# Patient Record
Sex: Female | Born: 1995 | Race: Asian | Hispanic: No | Marital: Single | Smoking: Never smoker
Health system: Southern US, Community
[De-identification: ages and names within clinical notes are randomized; demographics above are authoritative.]

## PROBLEM LIST (undated history)

## (undated) HISTORY — PX: APPENDECTOMY: SHX54

---

## 2019-09-13 ENCOUNTER — Encounter: Payer: Self-pay | Admitting: Emergency Medicine

## 2019-09-13 ENCOUNTER — Emergency Department: Payer: Managed Care, Other (non HMO)

## 2019-09-13 ENCOUNTER — Other Ambulatory Visit: Payer: Self-pay

## 2019-09-13 ENCOUNTER — Emergency Department
Admission: EM | Admit: 2019-09-13 | Discharge: 2019-09-13 | Disposition: A | Discharge status: Home / Self Care | Payer: Managed Care, Other (non HMO) | Attending: Emergency Medicine | Admitting: Emergency Medicine

## 2019-09-13 DIAGNOSIS — Y999 Unspecified external cause status: Secondary | ICD-10-CM | POA: Diagnosis not present

## 2019-09-13 DIAGNOSIS — S6992XA Unspecified injury of left wrist, hand and finger(s), initial encounter: Secondary | ICD-10-CM | POA: Diagnosis present

## 2019-09-13 DIAGNOSIS — Y929 Unspecified place or not applicable: Secondary | ICD-10-CM | POA: Insufficient documentation

## 2019-09-13 DIAGNOSIS — Y9389 Activity, other specified: Secondary | ICD-10-CM | POA: Insufficient documentation

## 2019-09-13 DIAGNOSIS — S52501A Unspecified fracture of the lower end of right radius, initial encounter for closed fracture: Secondary | ICD-10-CM

## 2019-09-13 DIAGNOSIS — W010XXA Fall on same level from slipping, tripping and stumbling without subsequent striking against object, initial encounter: Secondary | ICD-10-CM | POA: Insufficient documentation

## 2019-09-13 DIAGNOSIS — S52502A Unspecified fracture of the lower end of left radius, initial encounter for closed fracture: Secondary | ICD-10-CM | POA: Diagnosis not present

## 2019-09-13 MED ORDER — IBUPROFEN 600 MG PO TABS: 600.0000 mg | ORAL_TABLET | Freq: Three times a day (TID) | ORAL | 0 refills | Status: DC | PRN

## 2019-09-13 MED ORDER — HYDROMORPHONE HCL 1 MG/ML IJ SOLN
1.0000 mg | Freq: Once | INTRAMUSCULAR | Status: AC
  Administered 2019-09-13: 1 mg via INTRAMUSCULAR
  Filled 2019-09-13: qty 1

## 2019-09-13 MED ORDER — KETOROLAC TROMETHAMINE 60 MG/2ML IM SOLN
60.0000 mg | Freq: Once | INTRAMUSCULAR | Status: DC
  Filled 2019-09-13: qty 2

## 2019-09-13 MED ORDER — KETOROLAC TROMETHAMINE 30 MG/ML IJ SOLN
30.0000 mg | Freq: Once | INTRAMUSCULAR | Status: AC
  Administered 2019-09-13: 30 mg via INTRAVENOUS

## 2019-09-13 MED ORDER — OXYCODONE-ACETAMINOPHEN 7.5-325 MG PO TABS: 1.0000 | ORAL_TABLET | Freq: Four times a day (QID) | ORAL | 0 refills | Status: DC | PRN

## 2019-09-13 NOTE — ED Notes (Signed)
See triage note  States she fell while trying to help b/f  Positive deformity   Good pulses

## 2019-09-13 NOTE — ED Provider Notes (Signed)
.  Ortho Injury Treatment  Date/Time: 09/13/2019 3:04 PM Performed by: Arta Silence, MD Authorized by: Arta Silence, MD   Consent:    Consent obtained:  Verbal   Consent given by:  Patient   Risks discussed:  Fracture, nerve damage, restricted joint movement and stiffness   Alternatives discussed:  ImmobilizationInjury location: wrist Location details: left wrist Injury type: fracture Fracture type: distal radius Pre-procedure neurovascular assessment: neurovascularly intact Pre-procedure distal perfusion: normal Pre-procedure neurological function: normal Pre-procedure range of motion: normal Anesthesia: hematoma block  Anesthesia: Local anesthesia used: yes Local Anesthetic: lidocaine 1% without epinephrine Anesthetic total: 5 mL  Patient sedated: NoManipulation performed: yes Skin traction used: yes Reduction successful: yes X-ray confirmed reduction: yes Immobilization: splint Splint type: sugar tong Supplies used: Ortho-Glass Post-procedure neurovascular assessment: post-procedure neurovascularly intact Post-procedure distal perfusion: normal Post-procedure neurological function: normal Post-procedure range of motion: normal Patient tolerance: patient tolerated the procedure well with no immediate complications       Arta Silence, MD 09/13/19 1505
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
## 2019-09-13 NOTE — Discharge Instructions (Signed)
Follow discharge care instruction and wear splint until evaluation by orthopedics.  Be advised pain medication may cause drowsiness.

## 2019-09-13 NOTE — ED Notes (Signed)
E-signature not working at this time. Pt verbalized understanding of D/C instructions, prescriptions and follow up care with no further questions at this time. Pt in NAD and ambulatory at time of D/C.  

## 2019-09-16 ENCOUNTER — Other Ambulatory Visit: Payer: Self-pay

## 2019-09-16 ENCOUNTER — Other Ambulatory Visit: Payer: Self-pay | Admitting: Orthopedic Surgery

## 2019-09-16 ENCOUNTER — Other Ambulatory Visit
Admission: RE | Admit: 2019-09-16 | Discharge: 2019-09-16 | Disposition: A | Discharge status: Home / Self Care | Payer: Managed Care, Other (non HMO) | Source: Ambulatory Visit | Attending: Orthopedic Surgery | Admitting: Orthopedic Surgery

## 2019-09-16 DIAGNOSIS — Z20822 Contact with and (suspected) exposure to covid-19: Secondary | ICD-10-CM | POA: Insufficient documentation

## 2019-09-16 DIAGNOSIS — Z01812 Encounter for preprocedural laboratory examination: Secondary | ICD-10-CM | POA: Insufficient documentation

## 2019-09-16 MED ORDER — CEFAZOLIN SODIUM-DEXTROSE 2-4 GM/100ML-% IV SOLN
2.0000 g | INTRAVENOUS | Status: AC
  Administered 2019-09-17: 16:00:00 2 g via INTRAVENOUS

## 2019-09-17 ENCOUNTER — Ambulatory Visit: Payer: Managed Care, Other (non HMO) | Admitting: Anesthesiology

## 2019-09-17 ENCOUNTER — Other Ambulatory Visit: Payer: Self-pay

## 2019-09-17 ENCOUNTER — Ambulatory Visit: Payer: Managed Care, Other (non HMO)

## 2019-09-17 ENCOUNTER — Encounter: Payer: Self-pay | Admitting: Orthopedic Surgery

## 2019-09-17 ENCOUNTER — Ambulatory Visit
Admission: RE | Admit: 2019-09-17 | Discharge: 2019-09-17 | Disposition: A | Discharge status: Home / Self Care | Payer: Managed Care, Other (non HMO) | Attending: Orthopedic Surgery | Admitting: Orthopedic Surgery

## 2019-09-17 ENCOUNTER — Encounter
Admission: RE | Disposition: A | Discharge status: Home / Self Care | Payer: Self-pay | Source: Home / Self Care | Attending: Orthopedic Surgery

## 2019-09-17 DIAGNOSIS — S52502A Unspecified fracture of the lower end of left radius, initial encounter for closed fracture: Secondary | ICD-10-CM | POA: Diagnosis present

## 2019-09-17 DIAGNOSIS — W010XXA Fall on same level from slipping, tripping and stumbling without subsequent striking against object, initial encounter: Secondary | ICD-10-CM | POA: Diagnosis not present

## 2019-09-17 DIAGNOSIS — S52612A Displaced fracture of left ulna styloid process, initial encounter for closed fracture: Secondary | ICD-10-CM | POA: Insufficient documentation

## 2019-09-17 DIAGNOSIS — Z8781 Personal history of (healed) traumatic fracture: Secondary | ICD-10-CM

## 2019-09-17 DIAGNOSIS — G5602 Carpal tunnel syndrome, left upper limb: Secondary | ICD-10-CM | POA: Insufficient documentation

## 2019-09-17 HISTORY — PX: OPEN REDUCTION INTERNAL FIXATION (ORIF) DISTAL RADIAL FRACTURE: SHX5989

## 2019-09-17 HISTORY — PX: CARPAL TUNNEL RELEASE: SHX101

## 2019-09-17 LAB — SARS CORONAVIRUS 2 (TAT 6-24 HRS): SARS Coronavirus 2: NEGATIVE

## 2019-09-17 LAB — POCT PREGNANCY, URINE
Preg Test, Ur: NEGATIVE
Preg Test, Ur: NEGATIVE

## 2019-09-17 SURGERY — OPEN REDUCTION INTERNAL FIXATION (ORIF) DISTAL RADIUS FRACTURE
Anesthesia: General | Laterality: Left

## 2019-09-17 MED ORDER — DEXMEDETOMIDINE HCL 200 MCG/2ML IV SOLN
INTRAVENOUS | Status: DC | PRN
  Administered 2019-09-17: 8 ug via INTRAVENOUS

## 2019-09-17 MED ORDER — FENTANYL CITRATE (PF) 100 MCG/2ML IJ SOLN
INTRAMUSCULAR | Status: AC
  Filled 2019-09-17: qty 2

## 2019-09-17 MED ORDER — BUPIVACAINE HCL (PF) 0.5 % IJ SOLN
INTRAMUSCULAR | Status: AC
  Filled 2019-09-17: qty 30

## 2019-09-17 MED ORDER — NEOMYCIN-POLYMYXIN B GU 40-200000 IR SOLN
Status: DC | PRN
  Administered 2019-09-17: 4 mL

## 2019-09-17 MED ORDER — BUPIVACAINE HCL 0.5 % IJ SOLN
INTRAMUSCULAR | Status: DC | PRN
  Administered 2019-09-17: 10 mL

## 2019-09-17 MED ORDER — CEFAZOLIN SODIUM-DEXTROSE 2-4 GM/100ML-% IV SOLN
INTRAVENOUS | Status: AC
  Filled 2019-09-17: qty 100

## 2019-09-17 MED ORDER — NEOMYCIN-POLYMYXIN B GU 40-200000 IR SOLN
Status: AC
  Filled 2019-09-17: qty 20

## 2019-09-17 MED ORDER — ONDANSETRON HCL 4 MG/2ML IJ SOLN: 4.0000 mg | Freq: Once | INTRAMUSCULAR | Status: DC | PRN

## 2019-09-17 MED ORDER — FENTANYL CITRATE (PF) 100 MCG/2ML IJ SOLN
25.0000 ug | INTRAMUSCULAR | Status: DC | PRN
  Administered 2019-09-17 (×3): 25 ug via INTRAVENOUS

## 2019-09-17 MED ORDER — PHENYLEPHRINE HCL (PRESSORS) 10 MG/ML IV SOLN
INTRAVENOUS | Status: DC | PRN
  Administered 2019-09-17: 50 ug via INTRAVENOUS

## 2019-09-17 MED ORDER — FENTANYL CITRATE (PF) 100 MCG/2ML IJ SOLN
INTRAMUSCULAR | Status: DC | PRN
  Administered 2019-09-17: 50 ug via INTRAVENOUS
  Administered 2019-09-17 (×2): 25 ug via INTRAVENOUS

## 2019-09-17 MED ORDER — OXYCODONE-ACETAMINOPHEN 7.5-325 MG PO TABS
ORAL_TABLET | ORAL | Status: AC
  Filled 2019-09-17: qty 1

## 2019-09-17 MED ORDER — CHLORHEXIDINE GLUCONATE 4 % EX LIQD
60.0000 mL | Freq: Once | CUTANEOUS | Status: AC
  Administered 2019-09-17: 4 via TOPICAL

## 2019-09-17 MED ORDER — LACTATED RINGERS IV SOLN: INTRAVENOUS | Status: DC

## 2019-09-17 MED ORDER — LIDOCAINE HCL (CARDIAC) PF 100 MG/5ML IV SOSY
PREFILLED_SYRINGE | INTRAVENOUS | Status: DC | PRN
  Administered 2019-09-17: 60 mg via INTRAVENOUS

## 2019-09-17 MED ORDER — GLYCOPYRROLATE 0.2 MG/ML IJ SOLN
INTRAMUSCULAR | Status: DC | PRN
  Administered 2019-09-17: .2 mg via INTRAVENOUS

## 2019-09-17 MED ORDER — MIDAZOLAM HCL 2 MG/2ML IJ SOLN
INTRAMUSCULAR | Status: AC
  Filled 2019-09-17: qty 2

## 2019-09-17 MED ORDER — ONDANSETRON HCL 4 MG/2ML IJ SOLN
INTRAMUSCULAR | Status: DC | PRN
  Administered 2019-09-17: 4 mg via INTRAVENOUS

## 2019-09-17 MED ORDER — PROPOFOL 10 MG/ML IV BOLUS
INTRAVENOUS | Status: DC | PRN
  Administered 2019-09-17: 200 mg via INTRAVENOUS

## 2019-09-17 MED ORDER — PROPOFOL 10 MG/ML IV BOLUS
INTRAVENOUS | Status: AC
  Filled 2019-09-17: qty 20

## 2019-09-17 MED ORDER — MIDAZOLAM HCL 2 MG/2ML IJ SOLN
INTRAMUSCULAR | Status: DC | PRN
  Administered 2019-09-17: 2 mg via INTRAVENOUS

## 2019-09-17 MED ORDER — OXYCODONE-ACETAMINOPHEN 7.5-325 MG PO TABS
1.0000 | ORAL_TABLET | Freq: Four times a day (QID) | ORAL | Status: DC | PRN
  Administered 2019-09-17: 1 via ORAL

## 2019-09-17 MED ORDER — DEXAMETHASONE SODIUM PHOSPHATE 10 MG/ML IJ SOLN
INTRAMUSCULAR | Status: DC | PRN
  Administered 2019-09-17 (×2): 8 mg via INTRAVENOUS

## 2019-09-17 SURGICAL SUPPLY — 38 items
BNDG ELASTIC 3X5.8 VLCR STR LF (GAUZE/BANDAGES/DRESSINGS) ×2 IMPLANT
BNDG ELASTIC 4X5.8 VLCR STR LF (GAUZE/BANDAGES/DRESSINGS) ×2 IMPLANT
CANISTER SUCT 1200ML W/VALVE (MISCELLANEOUS) ×2 IMPLANT
CHLORAPREP W/TINT 26 (MISCELLANEOUS) ×2 IMPLANT
COVER WAND RF STERILE (DRAPES) ×2 IMPLANT
CUFF TOURN SGL QUICK 18X4 (TOURNIQUET CUFF) ×1 IMPLANT
DRAPE FLUOR MINI C-ARM 54X84 (DRAPES) ×2 IMPLANT
ELECT CAUTERY NDL 2.0 MIC (NEEDLE) IMPLANT
ELECT CAUTERY NEEDLE 2.0 MIC (NEEDLE) IMPLANT
ELECT REM PT RETURN 9FT ADLT (ELECTROSURGICAL) ×2
ELECTRODE REM PT RTRN 9FT ADLT (ELECTROSURGICAL) ×1 IMPLANT
GAUZE SPONGE 4X4 12PLY STRL (GAUZE/BANDAGES/DRESSINGS) ×2 IMPLANT
GAUZE XEROFORM 1X8 LF (GAUZE/BANDAGES/DRESSINGS) ×4 IMPLANT
GLOVE SURG SYN 9.0  PF PI (GLOVE) ×1
GLOVE SURG SYN 9.0 PF PI (GLOVE) ×1 IMPLANT
GOWN SRG 2XL LVL 4 RGLN SLV (GOWNS) ×1 IMPLANT
GOWN STRL NON-REIN 2XL LVL4 (GOWNS) ×1
GOWN STRL REUS W/ TWL LRG LVL3 (GOWN DISPOSABLE) ×1 IMPLANT
GOWN STRL REUS W/TWL LRG LVL3 (GOWN DISPOSABLE) ×1
KIT TURNOVER KIT A (KITS) ×2 IMPLANT
NDL FILTER BLUNT 18X1 1/2 (NEEDLE) ×1 IMPLANT
NEEDLE FILTER BLUNT 18X 1/2SAF (NEEDLE) ×1
NEEDLE FILTER BLUNT 18X1 1/2 (NEEDLE) ×1 IMPLANT
NS IRRIG 500ML POUR BTL (IV SOLUTION) ×2 IMPLANT
PACK EXTREMITY ARMC (MISCELLANEOUS) ×2 IMPLANT
PAD CAST CTTN 4X4 STRL (SOFTGOODS) ×2 IMPLANT
PADDING CAST COTTON 4X4 STRL (SOFTGOODS) ×2
PEG LOCKING SMOOTH 2.2X14 (Peg) ×1 IMPLANT
PEG LOCKING SMOOTH 2.2X16 (Screw) ×2 IMPLANT
PEG LOCKING SMOOTH 2.2X18 (Peg) ×1 IMPLANT
SCALPEL PROTECTED #15 DISP (BLADE) ×4 IMPLANT
SCREW CORT 3.5X10 LNG (Screw) ×3 IMPLANT
SPLINT CAST 1 STEP 3X12 (MISCELLANEOUS) ×2 IMPLANT
SUT ETHILON 4-0 (SUTURE) ×1
SUT ETHILON 4-0 FS2 18XMFL BLK (SUTURE) ×1
SUT VICRYL 3-0 27IN (SUTURE) ×2 IMPLANT
SUTURE ETHLN 4-0 FS2 18XMF BLK (SUTURE) ×1 IMPLANT
SYR 3ML LL SCALE MARK (SYRINGE) ×2 IMPLANT

## 2019-09-17 NOTE — Anesthesia Preprocedure Evaluation (Signed)
Anesthesia Evaluation  Patient identified by MRN, date of birth, ID band Patient awake    Reviewed: Allergy & Precautions, H&P , NPO status , Patient's Chart, lab work & pertinent test results, reviewed documented beta blocker date and time   Airway Mallampati: II  TM Distance: >3 FB Neck ROM: full    Dental  (+) Teeth Intact   Pulmonary neg pulmonary ROS,    Pulmonary exam normal        Cardiovascular Exercise Tolerance: Good negative cardio ROS Normal cardiovascular exam Rate:Normal     Neuro/Psych negative neurological ROS  negative psych ROS   GI/Hepatic negative GI ROS, Neg liver ROS,   Endo/Other  negative endocrine ROS  Renal/GU negative Renal ROS  negative genitourinary   Musculoskeletal   Abdominal   Peds  Hematology negative hematology ROS (+)   Anesthesia Other Findings   Reproductive/Obstetrics negative OB ROS                             Anesthesia Physical Anesthesia Plan  ASA: I and emergent  Anesthesia Plan: General LMA   Post-op Pain Management:    Induction:   PONV Risk Score and Plan:   Airway Management Planned:   Additional Equipment:   Intra-op Plan:   Post-operative Plan:   Informed Consent: I have reviewed the patients History and Physical, chart, labs and discussed the procedure including the risks, benefits and alternatives for the proposed anesthesia with the patient or authorized representative who has indicated his/her understanding and acceptance.       Plan Discussed with: CRNA  Anesthesia Plan Comments:         Anesthesia Quick Evaluation

## 2019-09-17 NOTE — Anesthesia Procedure Notes (Signed)
Procedure Name: LMA Insertion Date/Time: 09/17/2019 4:11 PM Performed by: Hedda Slade, CRNA Pre-anesthesia Checklist: Patient identified, Patient being monitored, Timeout performed, Emergency Drugs available and Suction available Patient Re-evaluated:Patient Re-evaluated prior to induction Oxygen Delivery Method: Circle system utilized Preoxygenation: Pre-oxygenation with 100% oxygen Induction Type: IV induction Ventilation: Mask ventilation without difficulty LMA: LMA inserted LMA Size: 3.5 Tube type: Oral Number of attempts: 1 Placement Confirmation: positive ETCO2 and breath sounds checked- equal and bilateral Tube secured with: Tape Dental Injury: Teeth and Oropharynx as per pre-operative assessment

## 2019-09-17 NOTE — Op Note (Signed)
09/17/2019  5:23 PM  PATIENT:  Emma Murray  24 y.o. female  PRE-OPERATIVE DIAGNOSIS:  DISPLACED FRACTURE OF DISTAL END OF LEFT RADIUS, acute carpal tunnel syndrome  POST-OPERATIVE DIAGNOSIS:  DISPLACED FRACTURE OF DISTAL END OF LEFT RADIUS, acute carpal tunnel syndrome  PROCEDURE:  Procedure(s): OPEN REDUCTION INTERNAL FIXATION (ORIF) DISTAL RADIAL FRACTURE (Left) CARPAL TUNNEL RELEASE (Left)  SURGEON: Laurene Footman, MD  ASSISTANTS: None  ANESTHESIA:   general  EBL:  Total I/O In: 900 [I.V.:800; IV Piggyback:100] Out: 10 [Blood:10]  BLOOD ADMINISTERED:none  DRAINS: none   LOCAL MEDICATIONS USED:  MARCAINE     SPECIMEN:  No Specimen  DISPOSITION OF SPECIMEN:  N/A  COUNTS:  YES  TOURNIQUET:   Total Tourniquet Time Documented: Upper Arm (Left) - 29 minutes Total: Upper Arm (Left) - 29 minutes   IMPLANTS: Hand innovations short narrow DVR plate with multiple smooth pegs and 3 cortical screws  DICTATION: .Dragon Dictation patient was brought to the operating room and after adequate general anesthesia was obtained the left arm was prepped and draped in the usual sterile fashion.  After patient identification and timeout procedures were completed, tourniquet was raised.  A volar approach was made centered over the FCR tendon.  Tendon sheath incised and the tendon retracted radially with radial artery and associated veins.  The fascia underlying the FCR was then incised and there is a great deal of swelling.  The muscle was retracted ulnarly and the pronator was elevated off the proximal and distal fragments.  With fingertrap traction applied to the end of the bed and the use of a Soil scientist anatomic alignment could be obtained.  Mini C arm was used to assess alignment during the procedure.  Short narrow DVR plate was pinned into position to make sure is in the appropriate position when it was in the appropriate position distal first technique was utilized feeling for  the screw holes with smooth pegs using the fast guide as a guide.  Oblique views were obtained to make sure there is no penetration into the joint.  After these forward been placed this felt additional pegs would not be necessary with her fairly solid bone distally and so fast guides were removed and the plate brought to the shaft with 310 mm cortical screws placed.  Mini C-arm views with traction released showed stable fixation and essentially anatomic alignment.  The wound was irrigated and attention was turned to the carpal tunnel with a 1 and half centimeter incision made the skin and subcutaneous tissue were spread and the transcarpal ligament identified incision was carried down through this with the tendons underlying releases carried out proximally and distally to get full release of the carpal tunnel no obvious compression identified.  This incision was infiltrated with 10 cc of half percent Sensorcaine and the wound closed with simple interrupted 4-0 nylon skin sutures.  The tourniquet was let down at this point and 3-0 Vicryl subcutaneously 4-0 nylon for the skin for the wrist fracture incision followed by Xeroform 4 x 4's web roll volar splint and Ace wrap  PLAN OF CARE: Discharge to home today  PATIENT DISPOSITION:  PACU - hemodynamically stable.

## 2019-09-17 NOTE — Discharge Instructions (Addendum)
AMBULATORY SURGERY  DISCHARGE INSTRUCTIONS   1) The drugs that you were given will stay in your system until tomorrow so for the next 24 hours you should not:  A) Drive an automobile B) Make any legal decisions C) Drink any alcoholic beverage   2) You may resume regular meals tomorrow.  Today it is better to start with liquids and gradually work up to solid foods.  You may eat anything you prefer, but it is better to start with liquids, then soup and crackers, and gradually work up to solid foods.   3) Please notify your doctor immediately if you have any unusual bleeding, trouble breathing, redness and pain at the surgery site, drainage, fever, or pain not relieved by medication.    4) Additional Instructions: Please call in am for your post op appt, needs to be in 3 days       Please contact your physician with any problems or Same Day Surgery at [phone], Monday through Friday 6 am to 4 pm, or [place] at [facility] number at [phone].Keep arm elevated is much as possible. Work on moving her fingers is much as you can. Ice to the back of the wrist tonight and tomorrow to help with swelling. Pain medicine as previously directed.  Call our office if you are going to run out. Loosen Ace wrap if fingers get very swollen.  Keep splint and underlying dressing in place.

## 2019-09-17 NOTE — H&P (Signed)
Reviewed paper H+P, will be scanned into chart. No changes noted.  

## 2019-09-17 NOTE — Transfer of Care (Signed)
Immediate Anesthesia Transfer of Care Note  Patient: Emaly Righetti Smead  Procedure(s) Performed: OPEN REDUCTION INTERNAL FIXATION (ORIF) DISTAL RADIAL FRACTURE (Left ) CARPAL TUNNEL RELEASE (Left )  Patient Location: PACU  Anesthesia Type:General  Level of Consciousness: sedated  Airway & Oxygen Therapy: Patient Spontanous Breathing and Patient connected to face mask oxygen  Post-op Assessment: Report given to RN and Post -op Vital signs reviewed and stable  Post vital signs: Reviewed and stable  Last Vitals:  Vitals Value Taken Time  BP 108/59 09/17/19 1715  Temp 36 C 09/17/19 1715  Pulse 51 09/17/19 1718  Resp 18 09/17/19 1718  SpO2 100 % 09/17/19 1718  Vitals shown include unvalidated device data.  Last Pain:  Vitals:   09/17/19 1715  TempSrc:   PainSc: Asleep         Complications: No apparent anesthesia complications

## 2019-09-20 NOTE — Anesthesia Postprocedure Evaluation (Signed)
Anesthesia Post Note  Patient: Emma Murray  Procedure(s) Performed: OPEN REDUCTION INTERNAL FIXATION (ORIF) DISTAL RADIAL FRACTURE (Left ) CARPAL TUNNEL RELEASE (Left )  Patient location during evaluation: PACU Anesthesia Type: General Level of consciousness: awake and alert Pain management: pain level controlled Vital Signs Assessment: post-procedure vital signs reviewed and stable Respiratory status: spontaneous breathing, nonlabored ventilation, respiratory function stable and patient connected to nasal cannula oxygen Cardiovascular status: blood pressure returned to baseline and stable Postop Assessment: no apparent nausea or vomiting Anesthetic complications: no     Last Vitals:  Vitals:   09/17/19 1800 09/17/19 1813  BP: (!) 135/95   Pulse: 63 68  Resp: 13 14  Temp:  36.8 C  SpO2: 99% 99%    Last Pain:  Vitals:   09/18/19 0834  TempSrc:   PainSc: 0-No pain                 Molli Barrows

## 2019-11-25 ENCOUNTER — Other Ambulatory Visit: Payer: Self-pay

## 2019-11-25 ENCOUNTER — Emergency Department (HOSPITAL_COMMUNITY)
Admission: EM | Admit: 2019-11-25 | Discharge: 2019-11-26 | Disposition: A | Discharge status: Other Acute Inpatient Hospital | Payer: 59 | Attending: Emergency Medicine | Admitting: Emergency Medicine

## 2019-11-25 ENCOUNTER — Encounter (HOSPITAL_COMMUNITY): Payer: Self-pay | Admitting: Emergency Medicine

## 2019-11-25 DIAGNOSIS — F1290 Cannabis use, unspecified, uncomplicated: Secondary | ICD-10-CM | POA: Diagnosis not present

## 2019-11-25 DIAGNOSIS — Z20822 Contact with and (suspected) exposure to covid-19: Secondary | ICD-10-CM | POA: Insufficient documentation

## 2019-11-25 DIAGNOSIS — Z046 Encounter for general psychiatric examination, requested by authority: Secondary | ICD-10-CM | POA: Diagnosis present

## 2019-11-25 DIAGNOSIS — R45851 Suicidal ideations: Secondary | ICD-10-CM

## 2019-11-25 DIAGNOSIS — F322 Major depressive disorder, single episode, severe without psychotic features: Secondary | ICD-10-CM | POA: Diagnosis not present

## 2019-11-25 LAB — COMPREHENSIVE METABOLIC PANEL
ALT: 13 U/L (ref 0–44)
AST: 23 U/L (ref 15–41)
Albumin: 4.4 g/dL (ref 3.5–5.0)
Alkaline Phosphatase: 46 U/L (ref 38–126)
Anion gap: 8 (ref 5–15)
BUN: 10 mg/dL (ref 6–20)
CO2: 25 mmol/L (ref 22–32)
Calcium: 9.2 mg/dL (ref 8.9–10.3)
Chloride: 107 mmol/L (ref 98–111)
Creatinine, Ser: 0.65 mg/dL (ref 0.44–1.00)
GFR calc Af Amer: >60 mL/min (ref >60)
GFR calc non Af Amer: >60 mL/min (ref >60)
Glucose, Bld: 106 mg/dL — ABNORMAL HIGH (ref 70–99)
Potassium: 3.8 mmol/L (ref 3.5–5.1)
Sodium: 140 mmol/L (ref 135–145)
Total Bilirubin: 0.5 mg/dL (ref 0.3–1.2)
Total Protein: 7.6 g/dL (ref 6.5–8.1)

## 2019-11-25 LAB — RAPID URINE DRUG SCREEN, HOSP PERFORMED
Amphetamines: NOT DETECTED
Barbiturates: NOT DETECTED
Benzodiazepines: NOT DETECTED
Cocaine: NOT DETECTED
Opiates: NOT DETECTED
Tetrahydrocannabinol: POSITIVE — AB

## 2019-11-25 LAB — I-STAT BETA HCG BLOOD, ED (MC, WL, AP ONLY): I-stat hCG, quantitative: <5 m[IU]/mL (ref <5)

## 2019-11-25 LAB — ETHANOL: Alcohol, Ethyl (B): <10 mg/dL (ref <10)

## 2019-11-25 LAB — ACETAMINOPHEN LEVEL: Acetaminophen (Tylenol), Serum: <10 ug/mL — ABNORMAL LOW (ref 10–30)

## 2019-11-25 LAB — SALICYLATE LEVEL: Salicylate Lvl: <7 mg/dL — ABNORMAL LOW (ref 7.0–30.0)
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
## 2019-11-26 ENCOUNTER — Inpatient Hospital Stay (HOSPITAL_COMMUNITY)
Admission: AD | Admit: 2019-11-26 | Discharge: 2019-11-28 | DRG: 881 | Disposition: A | Discharge status: Home / Self Care | Payer: 59 | Attending: Psychiatry | Admitting: Psychiatry

## 2019-11-26 ENCOUNTER — Encounter (HOSPITAL_COMMUNITY): Payer: Self-pay | Admitting: Behavioral Health

## 2019-11-26 DIAGNOSIS — Z811 Family history of alcohol abuse and dependence: Secondary | ICD-10-CM

## 2019-11-26 DIAGNOSIS — R45851 Suicidal ideations: Secondary | ICD-10-CM | POA: Diagnosis present

## 2019-11-26 DIAGNOSIS — F322 Major depressive disorder, single episode, severe without psychotic features: Secondary | ICD-10-CM | POA: Diagnosis not present

## 2019-11-26 DIAGNOSIS — F329 Major depressive disorder, single episode, unspecified: Principal | ICD-10-CM | POA: Diagnosis present

## 2019-11-26 LAB — CBC
HCT: 39.5 % (ref 36.0–46.0)
Hemoglobin: 13.3 g/dL (ref 12.0–15.0)
MCH: 31.3 pg (ref 26.0–34.0)
MCHC: 33.7 g/dL (ref 30.0–36.0)
MCV: 92.9 fL (ref 80.0–100.0)
Platelets: 297 10*3/uL (ref 150–400)
RBC: 4.25 MIL/uL (ref 3.87–5.11)
RDW: 12.3 % (ref 11.5–15.5)
WBC: 10 10*3/uL (ref 4.0–10.5)
nRBC: 0 % (ref 0.0–0.2)

## 2019-11-26 LAB — RESPIRATORY PANEL BY RT PCR (FLU A&B, COVID)
Influenza A by PCR: NEGATIVE
Influenza B by PCR: NEGATIVE
SARS Coronavirus 2 by RT PCR: NEGATIVE

## 2019-11-26 MED ORDER — HYDROXYZINE HCL 25 MG PO TABS
25.0000 mg | ORAL_TABLET | Freq: Four times a day (QID) | ORAL | Status: DC | PRN
  Filled 2019-11-26: qty 1

## 2019-11-26 MED ORDER — TRAZODONE HCL 50 MG PO TABS
50.0000 mg | ORAL_TABLET | Freq: Every evening | ORAL | Status: DC | PRN
  Filled 2019-11-26: qty 1

## 2019-11-26 MED ORDER — MAGNESIUM HYDROXIDE 400 MG/5ML PO SUSP: 15.0000 mL | Freq: Every evening | ORAL | Status: DC | PRN

## 2019-11-26 MED ORDER — SERTRALINE HCL 25 MG PO TABS
25.0000 mg | ORAL_TABLET | Freq: Every day | ORAL | Status: DC
  Administered 2019-11-26 – 2019-11-28 (×3): 25 mg via ORAL
  Filled 2019-11-26 (×5): qty 1

## 2019-11-26 MED ORDER — ACETAMINOPHEN 325 MG PO TABS: 650.0000 mg | ORAL_TABLET | ORAL | Status: DC | PRN

## 2019-11-26 MED ORDER — ALUM & MAG HYDROXIDE-SIMETH 200-200-20 MG/5ML PO SUSP: 15.0000 mL | Freq: Four times a day (QID) | ORAL | Status: DC | PRN

## 2019-11-26 NOTE — Progress Notes (Signed)
Recreation Therapy Notes  Animal-Assisted Activity (AAA) Program Checklist/Progress Notes Patient Eligibility Criteria Checklist & Daily Group note for Rec Tx Intervention  Date: 4.20.21 Time: 38 Location: [address]   AAA/T Program Assumption of Risk Form signed by Teacher, music or Parent Legal Guardian  YES   Patient is free of allergies or sever asthma  YES   Patient reports no fear of animals  YES   Patient reports no history of cruelty to animals  YES   Patient understands his/her participation is voluntary  YES  Patient washes hands before animal contact  YES   Patient washes hands after animal contact  YES   Behavioral Response: Engaged  Education: Contractor, Appropriate Animal Interaction   Education Outcome: Acknowledges understanding/In group clarification offered/Needs additional education.   Clinical Observations/Feedback: Pt attended and participated in activity.    Victorino Sparrow, LRT/CTRS         Victorino Sparrow A 11/26/2019 3:31 PM
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
## 2019-11-26 NOTE — ED Notes (Signed)
Pt voiced understanding and agreement w/tx plan - Accepted to [facility] 301-2. Pt calling Shaun back to advise. ALL belongings - 1 labeled belongings bag - Deputy.

## 2019-11-26 NOTE — Progress Notes (Signed)
Pt accepted to [facility]; bed 301-2  Mordecai Maes, NP is the accepting provider.    Dr. Mallie Darting is the attending provider.    Call report to [phone]    Pt is scheduled to arrive at [place], [place], [place] Disposition [facility]/TTS [phone]

## 2019-11-26 NOTE — Progress Notes (Signed)
   11/26/19 2320  Psych Admission Type (Psych Patients Only)  Admission Status Involuntary  Psychosocial Assessment  Patient Complaints None  Eye Contact Fair  Facial Expression Anxious  Affect Depressed  Speech Logical/coherent  Interaction Guarded  Motor Activity Other (Comment) (WNL)  Appearance/Hygiene Unremarkable  Behavior Characteristics Cooperative  Mood Depressed  Thought Process  Coherency WDL  Content WDL  Delusions WDL  Perception WDL  Hallucination None reported or observed  Judgment Poor  Confusion None  Danger to Self  Current suicidal ideation? Denies  Danger to Others  Danger to Others None reported or observed

## 2019-11-26 NOTE — Progress Notes (Signed)
The patient verbalized in group that she enjoyed the pet therapy group. Her goal for tomorrow is to get discharged.

## 2019-11-26 NOTE — ED Notes (Signed)
IVC paperwork - 1st Exam completed by Dr Ashok Cordia - Copy faxed to [facility] - Copy sent to Medical Records - Original placed in folder for Magistrate - ALL 3 sets on clipboard.
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
## 2019-11-26 NOTE — ED Notes (Signed)
Pt talking to Shawn from phone at nurses' desk.

## 2019-11-26 NOTE — Progress Notes (Addendum)
D: Pt. Admitted. Patient was cooperative. Pt. Denies SI/HI/AVH Patient admitted to anxiety and reported that she wants to work on her "anger issues". Pt. Said she scratches herself to make herself feel better. Pt. Reported that she was going to jump off bridge, but her boyfriend came and got her off bridge. A: Pt. Was cooperative with a anxious mood R:  Patient contracts for safety.  Patient compliant with medication and treatment plan. Patient cooperative and calm.  Safety maintained.

## 2019-11-26 NOTE — ED Provider Notes (Signed)
[facility] EMERGENCY DEPARTMENT Provider Note   CSN: [id number] Arrival date & time: 11/25/19  2217     History Chief Complaint  Patient presents with  . Suicidal    Emma Murray is a 24 y.o. female with a history of prior appendectomy & prior ORIF of distal left radius fracture 09/2019 who presents to the ED for evaluation of suicidal ideations intermittently for the past coupe of months. Patient states she has had intermittent thoughts of self harm due to feeling sad, frustrated, and useless. She states since she broke her arm she cannot do as many things for herself which is aggravating her suicidal thoughts. Last night she was standing at a bridge ready to jump to commit suicide when someone pulled her down. IVC by Event organiser. Patient denies HI or hallucinations. Denies alcohol or drug use.   HPI     History reviewed. No pertinent past medical history.  There are no problems to display for this patient.   Past Surgical History:  Procedure Laterality Date  . APPENDECTOMY    . CARPAL TUNNEL RELEASE Left 09/17/2019   Procedure: CARPAL TUNNEL RELEASE;  Surgeon: Hessie Knows, MD;  Location: [facility] ORS;  Service: Orthopedics;  Laterality: Left;  . OPEN REDUCTION INTERNAL FIXATION (ORIF) DISTAL RADIAL FRACTURE Left 09/17/2019   Procedure: OPEN REDUCTION INTERNAL FIXATION (ORIF) DISTAL RADIAL FRACTURE;  Surgeon: Hessie Knows, MD;  Location: [facility] ORS;  Service: Orthopedics;  Laterality: Left;     OB History   No obstetric history on file.     No family history on file.  Social History   Tobacco Use  . Smoking status: Never Smoker  . Smokeless tobacco: Never Used  Substance Use Topics  . Alcohol use: Not on file  . Drug use: Not on file    Home Medications Prior to Admission medications   Medication Sig Start Date End Date Taking? Authorizing Provider  ibuprofen (ADVIL) 600 MG tablet Take 1 tablet (600 mg total) by mouth every 8 (eight) hours  as needed. 09/13/19   Sable Feil, PA-C  oxyCODONE-acetaminophen (PERCOCET) 7.5-325 MG tablet Take 1 tablet by mouth every 6 (six) hours as needed. Patient taking differently: Take 1 tablet by mouth every 6 (six) hours as needed for moderate pain.  09/13/19   Sable Feil, PA-C    Allergies    Patient has no known allergies.  Review of Systems   Review of Systems  Constitutional: Negative for chills and fever.  Respiratory: Negative for shortness of breath.   Cardiovascular: Negative for chest pain.  Gastrointestinal: Negative for abdominal pain.  Genitourinary: Negative for dysuria.  Neurological: Negative for syncope.  Psychiatric/Behavioral: Positive for suicidal ideas. Negative for hallucinations.  All other systems reviewed and are negative.   Physical Exam Updated Vital Signs BP 120/76 (BP Location: Left Arm)   Pulse 84   Temp 98.1 F (36.7 C) (Oral)   Resp 16   Ht 5\' 3"  (1.6 m)   Wt 86.2 kg   SpO2 98%   BMI 33.66 kg/m   Physical Exam Vitals and nursing note reviewed.  Constitutional:      General: She is not in acute distress.    Appearance: She is well-developed. She is not toxic-appearing.  HENT:     Head: Normocephalic and atraumatic.  Eyes:     General:        Right eye: No discharge.        Left eye: No discharge.  Conjunctiva/sclera: Conjunctivae normal.  Cardiovascular:     Rate and Rhythm: Normal rate and regular rhythm.  Pulmonary:     Effort: Pulmonary effort is normal. No respiratory distress.     Breath sounds: Normal breath sounds. No wheezing, rhonchi or rales.  Abdominal:     General: There is no distension.     Palpations: Abdomen is soft.     Tenderness: There is no abdominal tenderness.  Musculoskeletal:     Cervical back: Neck supple.  Skin:    General: Skin is warm and dry.     Findings: No rash.  Neurological:     Mental Status: She is alert.     Comments: Clear speech.   Psychiatric:        Thought Content: Thought  content includes suicidal ideation. Thought content does not include homicidal ideation. Thought content includes suicidal plan. Thought content does not include homicidal plan.     ED Results / Procedures / Treatments   Labs (all labs ordered are listed, but only abnormal results are displayed) Labs Reviewed  COMPREHENSIVE METABOLIC PANEL - Abnormal; Notable for the following components:      Result Value   Glucose, Bld 106 (*)    All other components within normal limits  SALICYLATE LEVEL - Abnormal; Notable for the following components:   Salicylate Lvl Q000111Q (*)    All other components within normal limits  ACETAMINOPHEN LEVEL - Abnormal; Notable for the following components:   Acetaminophen (Tylenol), Serum <10 (*)    All other components within normal limits  RAPID URINE DRUG SCREEN, HOSP PERFORMED - Abnormal; Notable for the following components:   Tetrahydrocannabinol POSITIVE (*)    All other components within normal limits  RESPIRATORY PANEL BY RT PCR (FLU A&B, COVID)  ETHANOL  CBC  I-STAT BETA HCG BLOOD, ED ([facility], [facility], [facility] ONLY)    EKG None  Radiology No results found.  Procedures Procedures (including critical care time)  Medications Ordered in ED Medications - No data to display  ED Course  I have reviewed the triage vital signs and the nursing notes.  Pertinent labs & imaging results that were available during my care of the patient were reviewed by me and considered in my medical decision making (see chart for details).    [name] was evaluated in Emergency Department on 11/26/2019 for the symptoms described in the history of present illness. He/she was evaluated in the context of the global COVID-19 pandemic, which necessitated consideration that the patient might be at risk for infection with the SARS-CoV-2 virus that causes COVID-19. Institutional protocols and algorithms that pertain to the evaluation of patients at risk for COVID-19 are in a state  of rapid change based on information released by regulatory bodies including the CDC and federal and state organizations. These policies and algorithms were followed during the patient's care in the ED.  MDM Rules/Calculators/A&P                     Patient presents to the ED for evaluation of SI with plan.  IVC per law enforcement who have provided additional history to triage- I have obtained additional history per nursing note review. Benign physical exam, vitals WNL. Labs reviewed& interpreted including CBC, CMP, salicylate level, acetaminophen level, ethanol, UDS, and pregnancy test- fairly unremarkable.   Patient is medically cleared for TTS assessment. Disposition per [facility].  First look paperwork completed for IVC.   Patient recommended for inpatient- admit to [facility].  Final Clinical Impression(s) / ED Diagnoses Final diagnoses:  Suicidal ideation    Rx / DC Orders ED Discharge Orders    None       Amaryllis Dyke, PA-C 11/26/19 1328    Lajean Saver, MD 11/26/19 [phone]

## 2019-11-26 NOTE — ED Notes (Signed)
Pt arrived to Rm 52 - ambulatory wearing burgundy scrubs. Sitter w/pt. TTS being performed.
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
## 2019-11-27 LAB — LIPID PANEL
Cholesterol: 192 mg/dL (ref 0–200)
HDL: 73 mg/dL (ref >40)
LDL Cholesterol: 111 mg/dL — ABNORMAL HIGH (ref 0–99)
Total CHOL/HDL Ratio: 2.6 RATIO
Triglycerides: 40 mg/dL (ref <150)
VLDL: 8 mg/dL (ref 0–40)

## 2019-11-27 LAB — HEMOGLOBIN A1C
Hgb A1c MFr Bld: 5.3 % (ref 4.8–5.6)
Mean Plasma Glucose: 105.41 mg/dL

## 2019-11-27 LAB — TSH: TSH: 1.6 u[IU]/mL (ref 0.350–4.500)

## 2019-11-27 NOTE — Progress Notes (Signed)
Recreation Therapy Notes  Date:  4.21.21 Time: 0930 Location: [address] Group Room  Group Topic: Stress Management  Goal Area(s) Addresses:  Patient will identify positive stress management techniques. Patient will identify benefits of using stress management post d/c.  Intervention: Stress Management  Activity :  Guided Imagery.  LRT read a script that took patients on a walk along the beach.  Patients were to listen and follow along as script was read to engage in activity.  Education:  Stress Management, Discharge Planning.   Education Outcome: Acknowledges Education  Clinical Observations/Feedback: Pt did not attend group activity.    Victorino Sparrow, LRT/CTRS         Victorino Sparrow A 11/27/2019 11:17 AM

## 2019-11-27 NOTE — Progress Notes (Signed)
[facility] MD Progress Note  11/27/2019 1:11 PM Emma Murray  MRN:  [id number] Subjective: Patient is a 24 year old female with a past psychiatric history that is basically negative who was brought to the [facility] emergency department on 11/26/2019 under involuntary commitment after having been found on a bridge contemplating suicide.  Objective: Patient is seen and examined.  Patient is a 24 year old female with the above-stated past psychiatric history who is seen in follow-up.  She denied complaint today.  She is smiling and engaging.  She really minimizes everything that occurred prior to her bridge episode.  She did state that she is spoken with her boyfriend, and things are fine there.  I asked her to make sure there was a release of information so that we could talk to him to confirm safety and to see if he had any additional information for [place].  She was seen in treatment team as well today.  She did state that she took the Zoloft, and has had no side effects from it.  Review of her laboratories showed essentially normal lipid panel.  TSH was normal at 1.600.  Her vital signs are stable, she is afebrile.  She slept 6.75 hours last night.  She denied any suicidal ideation.  Principal Problem: <principal problem not specified> Diagnosis: Active Problems:   MDD (major depressive disorder)  Total Time spent with patient: 20 minutes  Past Psychiatric History: See admission H&P  Past Medical History: History reviewed. No pertinent past medical history.  Past Surgical History:  Procedure Laterality Date  . APPENDECTOMY    . CARPAL TUNNEL RELEASE Left 09/17/2019   Procedure: CARPAL TUNNEL RELEASE;  Surgeon: Hessie Knows, MD;  Location: [facility] ORS;  Service: Orthopedics;  Laterality: Left;  . OPEN REDUCTION INTERNAL FIXATION (ORIF) DISTAL RADIAL FRACTURE Left 09/17/2019   Procedure: OPEN REDUCTION INTERNAL FIXATION (ORIF) DISTAL RADIAL FRACTURE;  Surgeon: Hessie Knows, MD;  Location: [facility] ORS;   Service: Orthopedics;  Laterality: Left;   Family History: History reviewed. No pertinent family history. Family Psychiatric  History: See admission H&P Social History:  Social History   Substance and Sexual Activity  Alcohol Use Not Currently   Comment: no use of alcohol since age 89     Social History   Substance and Sexual Activity  Drug Use Never    Social History   Socioeconomic History  . Marital status: Single    Spouse name: Not on file  . Number of children: Not on file  . Years of education: Not on file  . Highest education level: Not on file  Occupational History  . Not on file  Tobacco Use  . Smoking status: Never Smoker  . Smokeless tobacco: Never Used  Substance and Sexual Activity  . Alcohol use: Not Currently    Comment: no use of alcohol since age 71  . Drug use: Never  . Sexual activity: Not on file  Other Topics Concern  . Not on file  Social History Narrative  . Not on file   Social Determinants of Health   Financial Resource Strain:   . Difficulty of Paying Living Expenses:   Food Insecurity:   . Worried About Charity fundraiser in the Last Year:   . Arboriculturist in the Last Year:   Transportation Needs:   . Film/video editor (Medical):   Marland Kitchen Lack of Transportation (Non-Medical):   Physical Activity:   . Days of Exercise per Week:   . Minutes of Exercise  per Session:   Stress:   . Feeling of Stress :   Social Connections:   . Frequency of Communication with Friends and Family:   . Frequency of Social Gatherings with Friends and Family:   . Attends Religious Services:   . Active Member of Clubs or Organizations:   . Attends Archivist Meetings:   Marland Kitchen Marital Status:    Additional Social History:                         Sleep: Good  Appetite:  Good  Current Medications: Current Facility-Administered Medications  Medication Dose Route Frequency Provider Last Rate Last Admin  . acetaminophen (TYLENOL)  tablet 650 mg  650 mg Oral Q4H PRN Sharma Covert, MD      . alum & mag hydroxide-simeth (MAALOX/MYLANTA) 200-200-20 MG/5ML suspension 15 mL  15 mL Oral Q6H PRN Sharma Covert, MD      . hydrOXYzine (ATARAX/VISTARIL) tablet 25 mg  25 mg Oral Q6H PRN Sharma Covert, MD      . magnesium hydroxide (MILK OF MAGNESIA) suspension 15 mL  15 mL Oral QHS PRN Sharma Covert, MD      . sertraline (ZOLOFT) tablet 25 mg  25 mg Oral Daily Sharma Covert, MD   25 mg at 11/27/19 0909  . traZODone (DESYREL) tablet 50 mg  50 mg Oral QHS PRN Sharma Covert, MD        Lab Results:  Results for orders placed or performed during the hospital encounter of 11/26/19 (from the past 48 hour(s))  Hemoglobin A1c     Status: None   Collection Time: 11/27/19  6:36 AM  Result Value Ref Range   Hgb A1c MFr Bld 5.3 4.8 - 5.6 %    Comment: (NOTE) Pre diabetes:          5.7%-6.4% Diabetes:              >6.4% Glycemic control for   <7.0% adults with diabetes    Mean Plasma Glucose 105.41 mg/dL    Comment: Performed at [facility], [place] [address], [place], [place] [address]  Lipid panel     Status: Abnormal   Collection Time: 11/27/19  6:36 AM  Result Value Ref Range   Cholesterol 192 0 - 200 mg/dL   Triglycerides 40 <150 mg/dL   HDL 73 >40 mg/dL   Total CHOL/HDL Ratio 2.6 RATIO   VLDL 8 0 - 40 mg/dL   LDL Cholesterol 111 (H) 0 - 99 mg/dL    Comment:        Total Cholesterol/HDL:CHD Risk Coronary Heart Disease Risk Table                     Men   Women  1/2 Average Risk   3.4   3.3  Average Risk       5.0   4.4  2 X Average Risk   9.6   7.1  3 X Average Risk  23.4   11.0        Use the calculated Patient Ratio above and the CHD Risk Table to determine the patient's CHD Risk.        ATP III CLASSIFICATION (LDL):  <100     mg/dL   Optimal  100-129  mg/dL   Near or Above  Optimal  130-159  mg/dL   Borderline  160-189  mg/dL   High  >190     mg/dL   Very  High Performed at [facility] [address] [place], [place] [address]   TSH     Status: None   Collection Time: 11/27/19  6:36 AM  Result Value Ref Range   TSH 1.600 0.350 - 4.500 uIU/mL    Comment: Performed by a 3rd Generation assay with a functional sensitivity of <=0.01 uIU/mL. Performed at [facility], [place] [address], [place], [place] [address]     Blood Alcohol level:  Lab Results  Component Value Date   ETH <10 [id number]    Metabolic Disorder Labs: Lab Results  Component Value Date   HGBA1C 5.3 11/27/2019   MPG 105.41 11/27/2019   No results found for: PROLACTIN Lab Results  Component Value Date   CHOL 192 11/27/2019   TRIG 40 11/27/2019   HDL 73 11/27/2019   CHOLHDL 2.6 11/27/2019   VLDL 8 11/27/2019   LDLCALC 111 (H) 11/27/2019    Physical Findings: AIMS:  , ,  ,  ,    CIWA:    COWS:     Musculoskeletal: Strength & Muscle Tone: within normal limits Gait & Station: normal Patient leans: N/A  Psychiatric Specialty Exam: Physical Exam  Nursing note and vitals reviewed. Constitutional: She is oriented to person, place, and time. She appears well-developed and well-nourished.  HENT:  Head: Normocephalic and atraumatic.  Respiratory: Effort normal.  Neurological: She is alert and oriented to person, place, and time.    Review of Systems  Blood pressure 109/76, pulse 73, temperature 97.6 F (36.4 C), temperature source Oral, resp. rate 16, height 5\' 3"  (1.6 m), weight 85.3 kg, SpO2 98 %.Body mass index is 33.3 kg/m.  General Appearance: Casual  Eye Contact:  Good  Speech:  Normal Rate  Volume:  Normal  Mood:  Euthymic  Affect:  Congruent  Thought Process:  Coherent and Descriptions of Associations: Intact  Orientation:  Full (Time, Place, and Person)  Thought Content:  Logical  Suicidal Thoughts:  No  Homicidal Thoughts:  No  Memory:  Immediate;   Good Recent;   Good Remote;   Good  Judgement:   Intact  Insight:  Fair  Psychomotor Activity:  Normal  Concentration:  Concentration: Good and Attention Span: Good  Recall:  Good  Fund of Knowledge:  Good  Language:  Good  Akathisia:  Negative  Handed:  Right  AIMS (if indicated):     Assets:  Desire for Improvement Housing Resilience  ADL's:  Intact  Cognition:  WNL  Sleep:  Number of Hours: 6.75     Treatment Plan Summary: Daily contact with patient to assess and evaluate symptoms and progress in treatment, Medication management and Plan : Patient is seen and examined.  Patient is a 24 year old female with the above-stated past psychiatric history who is seen in follow-up.   Diagnosis: #1 major depression, single episode, severe without psychotic features, #2 posttraumatic stress disorder, #3 cannabis use disorder  Patient is seen in follow-up.  She is doing well.  She denied any suicidal ideation.  She has taken the Zoloft and no side effects of that.  I do believe she is minimizing a lot of what is occurred.  Hopefully things will continue to go well.  We will contact her boyfriend for collateral information as well about safety issues after discharge.  1.  Continue hydroxyzine  25 mg p.o. every 6 hours as needed itching or anxiety. 2.  Continue Zoloft 25 mg p.o. daily for anxiety and depression. 3.  Continue trazodone 50 mg p.o. nightly as needed insomnia. 4.  Disposition planning-in progress.  Sharma Covert, MD 11/27/2019, 1:11 PM

## 2019-11-27 NOTE — B[facility] Group Notes (Signed)
LCSW Group Therapy Note  Type of Therapy/Topic: Group Therapy: Six Dimensions of Wellness  Participation Level: Active  Description of Group: This group will address the concept of wellness and the six concepts of wellness: occupational, physical, social, intellectual, spiritual, and emotional. Patients will be encouraged to process areas in their lives that are out of balance and identify reasons for remaining unbalanced. Patients will be encouraged to explore ways to practice healthy habits on a daily basis to attain better physical and mental health outcomes.  Therapeutic Goals: 1. Identify aspects of wellness that they are doing well. 2. Identify aspects of wellness that they would like to improve upon. 3. Identify one action they can take to improve an aspect of wellness in their lives.    Summary of Patient Progress: Emma Murray listened attentively to her peers throughout the discussion. When called upon, she shared that she feels she is doing well in regard to her physical and spiritual aspects of wellness. Emma Murray would like to work on her emotional wellness and regulation.  Therapeutic Modalities: Cognitive Behavioral Therapy Solution-Focused Therapy Relapse Prevention

## 2019-11-27 NOTE — Progress Notes (Signed)
[facility] Group Notes:  (Nursing/MHT/Case Management/Adjunct)  Date:  11/27/2019  Time:  2030  Type of Therapy:  wrap up group  Participation Level:  Active  Participation Quality:  Appropriate, Attentive, Sharing and Supportive  Affect:  Appropriate  Cognitive:  Appropriate  Insight:  Improving  Engagement in Group:  Engaged  Modes of Intervention:  Clarification, Education and Support  Summary of Progress/Problems: Positive thinking and positive change were discussed.   Shellia Cleverly 11/27/2019, 8:58 PM

## 2019-11-27 NOTE — Plan of Care (Signed)
Nurse discussed anxiety, depression and coping skills with patient.  
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
## 2019-11-27 NOTE — Tx Team (Signed)
Interdisciplinary Treatment and Diagnostic Plan Update  11/27/2019 Time of Session: 9:10am Emma Murray MRN: [id number]  Principal Diagnosis: <principal problem not specified>  Secondary Diagnoses: Active Problems:   MDD (major depressive disorder)   Current Medications:  Current Facility-Administered Medications  Medication Dose Route Frequency Provider Last Rate Last Admin  . acetaminophen (TYLENOL) tablet 650 mg  650 mg Oral Q4H PRN Sharma Covert, MD      . alum & mag hydroxide-simeth (MAALOX/MYLANTA) 200-200-20 MG/5ML suspension 15 mL  15 mL Oral Q6H PRN Sharma Covert, MD      . hydrOXYzine (ATARAX/VISTARIL) tablet 25 mg  25 mg Oral Q6H PRN Sharma Covert, MD      . magnesium hydroxide (MILK OF MAGNESIA) suspension 15 mL  15 mL Oral QHS PRN Sharma Covert, MD      . sertraline (ZOLOFT) tablet 25 mg  25 mg Oral Daily Sharma Covert, MD   25 mg at 11/27/19 0909  . traZODone (DESYREL) tablet 50 mg  50 mg Oral QHS PRN Sharma Covert, MD       PTA Medications: Medications Prior to Admission  Medication Sig Dispense Refill Last Dose  . ibuprofen (ADVIL) 600 MG tablet Take 1 tablet (600 mg total) by mouth every 8 (eight) hours as needed. (Patient not taking: Reported on 11/26/2019) 15 tablet 0   . oxyCODONE-acetaminophen (PERCOCET) 7.5-325 MG tablet Take 1 tablet by mouth every 6 (six) hours as needed. (Patient not taking: Reported on 11/26/2019) 20 tablet 0     Patient Stressors:    Patient Strengths:    Treatment Modalities: Medication Management, Group therapy, Case management,  1 to 1 session with clinician, Psychoeducation, Recreational therapy.   Physician Treatment Plan for Primary Diagnosis: <principal problem not specified> Long Term Goal(s): Improvement in symptoms so as ready for discharge Improvement in symptoms so as ready for discharge   Short Term Goals: Ability to identify changes in lifestyle to reduce recurrence of condition will  improve Ability to verbalize feelings will improve Ability to disclose and discuss suicidal ideas Ability to demonstrate self-control will improve Ability to identify and develop effective coping behaviors will improve Ability to maintain clinical measurements within normal limits will improve Ability to identify changes in lifestyle to reduce recurrence of condition will improve Ability to verbalize feelings will improve Ability to disclose and discuss suicidal ideas Ability to demonstrate self-control will improve Ability to identify and develop effective coping behaviors will improve Ability to maintain clinical measurements within normal limits will improve  Medication Management: Evaluate patient's response, side effects, and tolerance of medication regimen.  Therapeutic Interventions: 1 to 1 sessions, Unit Group sessions and Medication administration.  Evaluation of Outcomes: Not Met  Physician Treatment Plan for Secondary Diagnosis: Active Problems:   MDD (major depressive disorder)  Long Term Goal(s): Improvement in symptoms so as ready for discharge Improvement in symptoms so as ready for discharge   Short Term Goals: Ability to identify changes in lifestyle to reduce recurrence of condition will improve Ability to verbalize feelings will improve Ability to disclose and discuss suicidal ideas Ability to demonstrate self-control will improve Ability to identify and develop effective coping behaviors will improve Ability to maintain clinical measurements within normal limits will improve Ability to identify changes in lifestyle to reduce recurrence of condition will improve Ability to verbalize feelings will improve Ability to disclose and discuss suicidal ideas Ability to demonstrate self-control will improve Ability to identify and develop effective coping behaviors will  improve Ability to maintain clinical measurements within normal limits will improve     Medication  Management: Evaluate patient's response, side effects, and tolerance of medication regimen.  Therapeutic Interventions: 1 to 1 sessions, Unit Group sessions and Medication administration.  Evaluation of Outcomes: Not Met   RN Treatment Plan for Primary Diagnosis: <principal problem not specified> Long Term Goal(s): Knowledge of disease and therapeutic regimen to maintain health will improve  Short Term Goals: Ability to verbalize frustration and anger appropriately will improve, Ability to participate in decision making will improve, Ability to verbalize feelings will improve, Ability to disclose and discuss suicidal ideas and Ability to identify and develop effective coping behaviors will improve  Medication Management: RN will administer medications as ordered by provider, will assess and evaluate patient's response and provide education to patient for prescribed medication. RN will report any adverse and/or side effects to prescribing provider.  Therapeutic Interventions: 1 on 1 counseling sessions, Psychoeducation, Medication administration, Evaluate responses to treatment, Monitor vital signs and CBGs as ordered, Perform/monitor CIWA, COWS, AIMS and Fall Risk screenings as ordered, Perform wound care treatments as ordered.  Evaluation of Outcomes: Not Met   LCSW Treatment Plan for Primary Diagnosis: <principal problem not specified> Long Term Goal(s): Safe transition to appropriate next level of care at discharge, Engage patient in therapeutic group addressing interpersonal concerns.  Short Term Goals: Engage patient in aftercare planning with referrals and resources  Therapeutic Interventions: Assess for all discharge needs, 1 to 1 time with Social worker, Explore available resources and support systems, Assess for adequacy in community support network, Educate family and significant other(s) on suicide prevention, Complete Psychosocial Assessment, Interpersonal group  therapy.  Evaluation of Outcomes: Not Met   Progress in Treatment: Attending groups: No. New to unit  Participating in groups: No. Taking medication as prescribed: Yes. Toleration medication: Yes. Family/Significant other contact made: No, will contact:  the patient's boyfriend Patient understands diagnosis: Yes. Discussing patient identified problems/goals with staff: Yes. Medical problems stabilized or resolved: Yes. Denies suicidal/homicidal ideation: Yes. Issues/concerns per patient self-inventory: No. Other:   New problem(s) identified: None   New Short Term/Long Term Goal(s):medication stabilization, elimination of SI thoughts, development of comprehensive mental wellness plan.    Patient Goals:  "To get out"   Discharge Plan or Barriers: Patient recently admitted. CSW will continue to follow and assess for appropriate referrals and possible discharge planning.    Reason for Continuation of Hospitalization: Depression Medication stabilization Suicidal ideation  Estimated Length of Stay: 3-5 days   Attendees: Patient: Emma Murray 11/27/2019 10:52 AM  Physician: Dr. Myles Lipps, MD 11/27/2019 10:52 AM  Nursing:  11/27/2019 10:52 AM  RN Care Manager: 11/27/2019 10:52 AM  Social Worker: Radonna Ricker, LCSW 11/27/2019 10:52 AM  Recreational Therapist:  11/27/2019 10:52 AM  Other:  11/27/2019 10:52 AM  Other:  11/27/2019 10:52 AM  Other: 11/27/2019 10:52 AM    Scribe for Treatment Team: Marylee Floras, [place] 11/27/2019 10:52 AM

## 2019-11-27 NOTE — Progress Notes (Signed)
   11/27/19 2235  Psych Admission Type (Psych Patients Only)  Admission Status Involuntary  Psychosocial Assessment  Patient Complaints None  Eye Contact Fair  Facial Expression Animated  Affect Appropriate to circumstance  Speech Logical/coherent  Interaction Guarded  Motor Activity Other (Comment) (WNL)  Appearance/Hygiene Unremarkable  Behavior Characteristics Cooperative  Mood Pleasant  Thought Process  Coherency WDL  Content WDL  Delusions WDL  Perception WDL  Hallucination None reported or observed  Judgment Poor  Confusion None  Danger to Self  Current suicidal ideation? Denies  Danger to Others  Danger to Others None reported or observed   Pt seen at nurse's station. Pt smiling and looking forward to the future. Pt states that provider rescinded her IVC paperwork today. Also says that she plans to move back to [place] when she is discharged. She is from [place] and has support there.

## 2019-11-28 MED ORDER — SERTRALINE HCL 25 MG PO TABS: 25.0000 mg | ORAL_TABLET | Freq: Every day | ORAL | 0 refills | Status: DC

## 2019-11-28 MED ORDER — HYDROXYZINE HCL 25 MG PO TABS: 25.0000 mg | ORAL_TABLET | Freq: Four times a day (QID) | ORAL | 0 refills | Status: DC | PRN

## 2019-11-28 MED ORDER — TRAZODONE HCL 50 MG PO TABS: 50.0000 mg | ORAL_TABLET | Freq: Every evening | ORAL | 0 refills | Status: DC | PRN
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
## 2019-11-28 NOTE — Progress Notes (Signed)
  [facility] Adult Case Management Discharge Plan :  Will you be returning to the same living situation after discharge:  Yes,  patient is returning home with her boyfriend At discharge, do you have transportation home?: Yes,  patient;s boyfriend is picking her up Do you have the ability to pay for your medications: Yes,  [employer]  Release of information consent forms completed and in the chart;  Patient's signature needed at discharge.  Patient to Follow up at: Follow-up Information    [facility], [place] on 12/09/2019.   Why: Your appointment for medication management is Monday, 12/09/19 at 1:00 pm.  At that time, you will be referred for therapy.  This appointment will be held in person. Please call this provider at discharge. Be sure to provide any discharge paperwork.  Contact information: [place] [address] [phone]           Next level of care provider has access to [place] and Suicide Prevention discussed: Yes,  with the patient's boyfriend     Has patient been referred to the Quitline?: N/A patient is not a smoker  Patient has been referred for addiction treatment: N/A  Marylee Floras, [place] 11/28/2019, 9:47 AM

## 2019-11-28 NOTE — BHH Suicide Risk Assessment (Signed)
[facility] Discharge Suicide Risk Assessment   Principal Problem: <principal problem not specified> Discharge Diagnoses: Active Problems:   MDD (major depressive disorder)   Total Time spent with patient: 20 minutes  Musculoskeletal: Strength & Muscle Tone: within normal limits Gait & Station: normal Patient leans: N/A  Psychiatric Specialty Exam: Review of Systems  All other systems reviewed and are negative.   Blood pressure 131/87, pulse 90, temperature 98 F (36.7 C), temperature source Oral, resp. rate 20, height 5\' 3"  (1.6 m), weight 85.3 kg, SpO2 98 %.Body mass index is 33.3 kg/m.  General Appearance: Casual  Eye Contact::  Good  Speech:  Normal Rate409  Volume:  Normal  Mood:  Euthymic  Affect:  Congruent  Thought Process:  Coherent and Descriptions of Associations: Intact  Orientation:  Full (Time, Place, and Person)  Thought Content:  Logical  Suicidal Thoughts:  No  Homicidal Thoughts:  No  Memory:  Immediate;   Good Recent;   Good Remote;   Good  Judgement:  Intact  Insight:  Fair  Psychomotor Activity:  Normal  Concentration:  Good  Recall:  Good  Fund of Knowledge:Good  Language: Good  Akathisia:  Negative  Handed:  Right  AIMS (if indicated):     Assets:  Communication Skills Desire for Improvement Resilience Talents/Skills  Sleep:  Number of Hours: 5.5  Cognition: WNL  ADL's:  Intact   Mental Status Per Nursing Assessment::   On Admission:  Self-harm thoughts, Self-harm behaviors  Demographic Factors:  Low socioeconomic status  Loss Factors: Loss of significant relationship  Historical Factors: Impulsivity  Risk Reduction Factors:   Living with another person, especially a relative and Positive social support  Continued Clinical Symptoms:  Depression:   Impulsivity  Cognitive Features That Contribute To Risk:  None    Suicide Risk:  Minimal: No identifiable suicidal ideation.  Patients presenting with no risk factors but with morbid  ruminations; may be classified as minimal risk based on the severity of the depressive symptoms  Follow-up Information    Care, [facility] on 12/09/2019.   Why: You are scheduled for an appointment for medication management on 12/09/19 at 1:00 pm.  At that time, you will be referred for therapy.  This appointment will be held in person. Please call this provider at time of discharge. Contact information: [place] [address] [phone]           Plan Of Care/Follow-up recommendations:  Activity:  ad lib  Sharma Covert, MD 11/28/2019, 8:18 AM

## 2019-11-28 NOTE — BHH Suicide Risk Assessment (Signed)
[place] INPATIENT:  Family/Significant Other Suicide Prevention Education  Suicide Prevention Education:  Education Completed; with boyfriend, Howie Ill [phone]) has been identified by the patient as the family member/significant other with whom the patient will be residing, and identified as the person(s) who will aid the patient in the event of a mental health crisis (suicidal ideations/suicide attempt).  With written consent from the patient, the family member/significant other has been provided the following suicide prevention education, prior to the and/or following the discharge of the patient.  The suicide prevention education provided includes the following:  Suicide risk factors  Suicide prevention and interventions  National Suicide Hotline telephone number  [facility] assessment telephone number  [facility] Emergency Assistance [place] and/or Residential Mobile Crisis Unit telephone number  Request made of family/significant other to:  Remove weapons (e.g., guns, rifles, knives), all items previously/currently identified as safety concern.    Remove drugs/medications (over-the-counter, prescriptions, illicit drugs), all items previously/currently identified as a safety concern.  The family member/significant other verbalizes understanding of the suicide prevention education information provided.  The family member/significant other agrees to remove the items of safety concern listed above.   Shaun reports he does not have any concerns with the patient returning home today. Shaun asked CSW if it was recommended for the patient to continue her medication regiment once she discharges. CSW explained that it is recommended for the patient to continue her medications until she is advised otherwise by her outpatient provider. Shan expressed understanding and reports he will pick the patient up at 11:00am today. There were no further questions or  concerns.   Marylee Floras 11/28/2019, 9:05 AM

## 2019-11-28 NOTE — Progress Notes (Signed)
Pt discharged to lobby. Pt was stable and appreciative at that time. All papers and prescriptions were given and valuables returned. Verbal understanding expressed. Denies SI/HI and A/VH. Pt given opportunity to express concerns and ask questions.  

## 2019-12-08 ENCOUNTER — Other Ambulatory Visit: Payer: Self-pay

## 2019-12-08 ENCOUNTER — Encounter: Payer: Self-pay | Admitting: Emergency Medicine

## 2019-12-08 ENCOUNTER — Emergency Department: Payer: 59

## 2019-12-08 DIAGNOSIS — S6992XA Unspecified injury of left wrist, hand and finger(s), initial encounter: Secondary | ICD-10-CM | POA: Diagnosis present

## 2019-12-08 DIAGNOSIS — Z23 Encounter for immunization: Secondary | ICD-10-CM | POA: Insufficient documentation

## 2019-12-08 DIAGNOSIS — W540XXA Bitten by dog, initial encounter: Secondary | ICD-10-CM | POA: Diagnosis not present

## 2019-12-08 DIAGNOSIS — Z79899 Other long term (current) drug therapy: Secondary | ICD-10-CM | POA: Diagnosis not present

## 2019-12-08 DIAGNOSIS — S61452A Open bite of left hand, initial encounter: Secondary | ICD-10-CM | POA: Diagnosis not present

## 2019-12-08 DIAGNOSIS — Y93K9 Activity, other involving animal care: Secondary | ICD-10-CM | POA: Insufficient documentation

## 2019-12-08 DIAGNOSIS — Y92018 Other place in single-family (private) house as the place of occurrence of the external cause: Secondary | ICD-10-CM | POA: Insufficient documentation

## 2019-12-08 DIAGNOSIS — Y998 Other external cause status: Secondary | ICD-10-CM | POA: Insufficient documentation

## 2019-12-08 NOTE — ED Triage Notes (Signed)
Pt arrives ambulatory to triage with c/o dog bite x 1 hour ago. Pt states that it is her dog and does not have its rabies vaccinations but she does not want anyone notified. Pt has bit noted to right ring finger with controlled bleeding at this time.

## 2019-12-09 ENCOUNTER — Emergency Department
Admission: EM | Admit: 2019-12-09 | Discharge: 2019-12-09 | Disposition: A | Discharge status: Home / Self Care | Payer: 59 | Attending: Emergency Medicine | Admitting: Emergency Medicine

## 2019-12-09 DIAGNOSIS — W540XXA Bitten by dog, initial encounter: Secondary | ICD-10-CM

## 2019-12-09 MED ORDER — TETANUS-DIPHTH-ACELL PERTUSSIS 5-2.5-18.5 LF-MCG/0.5 IM SUSP
0.5000 mL | Freq: Once | INTRAMUSCULAR | Status: AC
  Administered 2019-12-09: 0.5 mL via INTRAMUSCULAR
  Filled 2019-12-09: qty 0.5

## 2019-12-09 MED ORDER — AMOXICILLIN-POT CLAVULANATE 875-125 MG PO TABS: 1.0000 | ORAL_TABLET | Freq: Two times a day (BID) | ORAL | 0 refills | Status: AC

## 2019-12-09 NOTE — ED Provider Notes (Signed)
[facility] Emergency Department Provider Note   ____________________________________________   First MD Initiated Contact with Patient 12/09/19 0301     (approximate)  I have reviewed the triage vital signs and the nursing notes.   HISTORY  Chief Complaint Animal Bite    HPI Emma Murray is a 24 y.o. female with no significant past medical history who presents to the ED complaint of dog bite.  Patient reports that the lights were off in her home and she went to pet her dog but the dog reacted poorly and bit her on her left hand.  She suffered small puncture wounds to the distal portion of her right ring finger.  Bleeding is controlled at this time.  She is unsure of her last tetanus, does state that the dog's rabies vaccine is not up-to-date.        History reviewed. No pertinent past medical history.  Patient Active Problem List   Diagnosis Date Noted  . MDD (major depressive disorder) 11/26/2019    Past Surgical History:  Procedure Laterality Date  . APPENDECTOMY    . CARPAL TUNNEL RELEASE Left 09/17/2019   Procedure: CARPAL TUNNEL RELEASE;  Surgeon: Hessie Knows, MD;  Location: [facility] ORS;  Service: Orthopedics;  Laterality: Left;  . OPEN REDUCTION INTERNAL FIXATION (ORIF) DISTAL RADIAL FRACTURE Left 09/17/2019   Procedure: OPEN REDUCTION INTERNAL FIXATION (ORIF) DISTAL RADIAL FRACTURE;  Surgeon: Hessie Knows, MD;  Location: [facility] ORS;  Service: Orthopedics;  Laterality: Left;    Prior to Admission medications   Medication Sig Start Date End Date Taking? Authorizing Provider  amoxicillin-clavulanate (AUGMENTIN) 875-125 MG tablet Take 1 tablet by mouth 2 (two) times daily for 7 days. 12/09/19 12/16/19  Blake Divine, MD  hydrOXYzine (ATARAX/VISTARIL) 25 MG tablet Take 1 tablet (25 mg total) by mouth every 6 (six) hours as needed for anxiety. 11/28/19   Lindell Spar I, NP  sertraline (ZOLOFT) 25 MG tablet Take 1 tablet (25 mg total) by mouth  daily. For depression 11/29/19   Lindell Spar I, NP  traZODone (DESYREL) 50 MG tablet Take 1 tablet (50 mg total) by mouth at bedtime as needed for sleep. 11/28/19   Encarnacion Slates, NP    Allergies Patient has no known allergies.  No family history on file.  Social History Social History   Tobacco Use  . Smoking status: Never Smoker  . Smokeless tobacco: Never Used  Substance Use Topics  . Alcohol use: Not Currently    Comment: no use of alcohol since age 24  . Drug use: Never    Review of Systems  Constitutional: No fever/chills Eyes: No visual changes. ENT: No sore throat. Cardiovascular: Denies chest pain. Respiratory: Denies shortness of breath. Gastrointestinal: No abdominal pain.  No nausea, no vomiting.  No diarrhea.  No constipation. Genitourinary: Negative for dysuria. Musculoskeletal: Negative for back pain. Skin: Negative for rash.  Positive for dog bite. Neurological: Negative for headaches, focal weakness or numbness.  ____________________________________________   PHYSICAL EXAM:  VITAL SIGNS: ED Triage Vitals  Enc Vitals Group     BP 12/08/19 2316 112/69     Pulse Rate 12/08/19 2316 98     Resp 12/08/19 2316 18     Temp 12/08/19 2316 98.2 F (36.8 C)     Temp Source 12/08/19 2316 Oral     SpO2 12/08/19 2316 99 %     Weight 12/08/19 2317 188 lb (85.3 kg)     Height 12/08/19 2317 5\' 3"  (1.6  m)     Head Circumference --      Peak Flow --      Pain Score 12/08/19 2316 0     Pain Loc --      Pain Edu? --      Excl. in [place]? --     Constitutional: Alert and oriented. Eyes: Conjunctivae are normal. Head: Atraumatic. Nose: No congestion/rhinnorhea. Mouth/Throat: Mucous membranes are moist. Neck: Normal ROM Cardiovascular: Normal rate, regular rhythm. Grossly normal heart sounds. Respiratory: Normal respiratory effort.  No retractions. Lungs CTAB. Gastrointestinal: Soft and nontender. No distention. Genitourinary: deferred Musculoskeletal: No  lower extremity tenderness nor edema. Neurologic:  Normal speech and language. No gross focal neurologic deficits are appreciated. Skin:  Skin is warm and dry. No rash noted.  2 less than 1 cm superficial lacerations to the distal portion of right ring finger. Psychiatric: Mood and affect are normal. Speech and behavior are normal.  ____________________________________________   LABS (all labs ordered are listed, but only abnormal results are displayed)  Labs Reviewed - No data to display   PROCEDURES  Procedure(s) performed (including Critical Care):  Procedures   ____________________________________________   INITIAL IMPRESSION / ASSESSMENT AND PLAN / ED COURSE       24 year old female presents to the ED following dog bite to the distal portion of her right ring finger.  She is neurovascularly intact throughout her hand and x-ray is negative for foreign body.  We will update her tetanus, patient states that dogs rabies vaccine is not up-to-date, however rabies seems unlikely in this animal.  We will hold off on rabies prophylaxis for now, but patient was advised to return to the ED for any abnormal behavior in her dog over the next 10 days.  She was also advised to return to the ED for increased pain, swelling, or redness.  We will start her on Augmentin, patient agrees with plan.      ____________________________________________   FINAL CLINICAL IMPRESSION(S) / ED DIAGNOSES  Final diagnoses:  Dog bite, initial encounter     ED Discharge Orders         Ordered    amoxicillin-clavulanate (AUGMENTIN) 875-125 MG tablet  2 times daily     12/09/19 0318           Note:  This document was prepared using Dragon voice recognition software and may include unintentional dictation errors.   Blake Divine, MD 12/09/19 [phone]

## 2019-12-09 NOTE — ED Notes (Signed)
Patient reports bitten by own dog earlier.  Patient with small laceration noted to right 4th digit, no active bleeding.  Patient reports that dog is not up to date on its shots.

## 2020-08-08 NOTE — L&D Delivery Note (Signed)
Delivery Summary for Emma Murray  Labor Events:   Preterm labor: No data found  Rupture date: 04/05/2021  Rupture time: 8:28 AM  Rupture type: Artificial  Fluid Color: Clear  Induction: No data found  Augmentation: No data found  Complications: No data found  Cervical ripening: No data found No data found   No data found     Delivery:   Episiotomy: No data found  Lacerations: No data found  Repair suture: No data found  Repair # of packets: No data found  Blood loss (ml): No data found   Information for the patient's newborn:  Shiffler, Girl Marit Lorenson [id number]   Delivery 04/05/2021 9:59 PM by  C-Section, Low Transverse Sex:  female Gestational Age: 85w1dDelivery Clinician:   Living?:         APGARS  One minute Five minutes Ten minutes  Skin color:        Heart rate:        Grimace:        Muscle tone:        Breathing:        Totals: 8  9      Presentation/position:      Resuscitation:   Cord information:    Disposition of cord blood:     Blood gases sent?  Complications:   Placenta: Delivered:       appearance Newborn Measurements: Weight: 7 lb 11.8 oz (3510 g)  Height: 19.25"  Head circumference:    Chest circumference:    Other providers:    Additional  information: Forceps:   Vacuum:   Breech:   Observed anomalies       See Dr. CAndreas Bloweroperative note for details of procedure.    CRubie Maid MD [facility]

## 2020-10-06 LAB — OB RESULTS CONSOLE RUBELLA ANTIBODY, IGM: Rubella: NON-IMMUNE/NOT IMMUNE

## 2020-10-06 LAB — OB RESULTS CONSOLE HEPATITIS B SURFACE ANTIGEN: Hepatitis B Surface Ag: NEGATIVE

## 2020-10-06 LAB — OB RESULTS CONSOLE VARICELLA ZOSTER ANTIBODY, IGG: Varicella: NON-IMMUNE/NOT IMMUNE

## 2021-03-04 LAB — OB RESULTS CONSOLE GC/CHLAMYDIA
Chlamydia: NEGATIVE
Gonorrhea: NEGATIVE

## 2021-03-04 LAB — OB RESULTS CONSOLE HIV ANTIBODY (ROUTINE TESTING): HIV: NONREACTIVE

## 2021-03-04 LAB — OB RESULTS CONSOLE GBS: GBS: NEGATIVE

## 2021-03-29 ENCOUNTER — Other Ambulatory Visit: Payer: Self-pay | Admitting: Obstetrics and Gynecology

## 2021-03-29 NOTE — Progress Notes (Signed)
Emma Murray is a 25 y.o. G1P0 female at 63w0ddated by u/s.  She presents to L&D for IOL for postdates  Pregnancy Issues: 1. Obesity in pregnancy  2. Rubella and Varicella non-immune   [facility]   Pertinent Results:  Prenatal Labs: Blood type/Rh O pos  Antibody screen neg  Rubella Non-Immune  Varicella Non-Immune  RPR NR  HBsAg Neg  HIV NR  GC neg  Chlamydia neg  Genetic screening negative  1 hour GTT 133  3 hour GTT   GBS neg    5. Post Partum Planning: - Infant feeding: Breastfeeding - Contraception: Depo - Tdap declined - Flu declined  JLinda Hedges CNM 03/29/2021 4:52 PM

## 2021-04-01 ENCOUNTER — Other Ambulatory Visit
Admission: RE | Admit: 2021-04-01 | Discharge: 2021-04-01 | Disposition: A | Discharge status: Home / Self Care | Payer: Medicaid Other | Source: Ambulatory Visit | Attending: Certified Nurse Midwife | Admitting: Certified Nurse Midwife

## 2021-04-01 ENCOUNTER — Other Ambulatory Visit: Payer: Self-pay

## 2021-04-01 DIAGNOSIS — Z20822 Contact with and (suspected) exposure to covid-19: Secondary | ICD-10-CM | POA: Insufficient documentation

## 2021-04-01 DIAGNOSIS — Z01812 Encounter for preprocedural laboratory examination: Secondary | ICD-10-CM | POA: Diagnosis present

## 2021-04-02 LAB — SARS CORONAVIRUS 2 (TAT 6-24 HRS): SARS Coronavirus 2: NEGATIVE

## 2021-04-04 ENCOUNTER — Other Ambulatory Visit: Payer: Self-pay

## 2021-04-04 ENCOUNTER — Inpatient Hospital Stay
Admission: EM | Admit: 2021-04-04 | Discharge: 2021-04-07 | DRG: 787 | Disposition: A | Discharge status: Home / Self Care | Payer: Medicaid Other | Attending: Obstetrics and Gynecology | Admitting: Obstetrics and Gynecology

## 2021-04-04 DIAGNOSIS — D62 Acute posthemorrhagic anemia: Secondary | ICD-10-CM | POA: Diagnosis not present

## 2021-04-04 DIAGNOSIS — O99214 Obesity complicating childbirth: Secondary | ICD-10-CM | POA: Diagnosis present

## 2021-04-04 DIAGNOSIS — O3413 Maternal care for benign tumor of corpus uteri, third trimester: Secondary | ICD-10-CM | POA: Diagnosis present

## 2021-04-04 DIAGNOSIS — Z23 Encounter for immunization: Secondary | ICD-10-CM | POA: Diagnosis not present

## 2021-04-04 DIAGNOSIS — O368330 Maternal care for abnormalities of the fetal heart rate or rhythm, third trimester, not applicable or unspecified: Secondary | ICD-10-CM | POA: Diagnosis not present

## 2021-04-04 DIAGNOSIS — O9081 Anemia of the puerperium: Secondary | ICD-10-CM | POA: Diagnosis not present

## 2021-04-04 DIAGNOSIS — Z3A41 41 weeks gestation of pregnancy: Secondary | ICD-10-CM

## 2021-04-04 DIAGNOSIS — O480 Post-term pregnancy: Secondary | ICD-10-CM | POA: Diagnosis present

## 2021-04-04 DIAGNOSIS — D252 Subserosal leiomyoma of uterus: Secondary | ICD-10-CM | POA: Diagnosis present

## 2021-04-04 DIAGNOSIS — Z98891 History of uterine scar from previous surgery: Secondary | ICD-10-CM

## 2021-04-04 LAB — CBC
HCT: 35.4 % — ABNORMAL LOW (ref 36.0–46.0)
Hemoglobin: 12.3 g/dL (ref 12.0–15.0)
MCH: 30.7 pg (ref 26.0–34.0)
MCHC: 34.7 g/dL (ref 30.0–36.0)
MCV: 88.3 fL (ref 80.0–100.0)
Platelets: 284 10*3/uL (ref 150–400)
RBC: 4.01 MIL/uL (ref 3.87–5.11)
RDW: 13.2 % (ref 11.5–15.5)
WBC: 12.3 10*3/uL — ABNORMAL HIGH (ref 4.0–10.5)
nRBC: 0 % (ref 0.0–0.2)

## 2021-04-04 LAB — TYPE AND SCREEN
ABO/RH(D): O POS
Antibody Screen: NEGATIVE

## 2021-04-04 LAB — RPR: RPR Ser Ql: NONREACTIVE

## 2021-04-04 LAB — ABO/RH: ABO/RH(D): O POS

## 2021-04-04 MED ORDER — OXYTOCIN BOLUS FROM INFUSION: 333.0000 mL | Freq: Once | INTRAVENOUS | Status: DC

## 2021-04-04 MED ORDER — LACTATED RINGERS IV SOLN
INTRAVENOUS | Status: DC
  Administered 2021-04-05: 1000 mL via INTRAVENOUS

## 2021-04-04 MED ORDER — SOD CITRATE-CITRIC ACID 500-334 MG/5ML PO SOLN: 30.0000 mL | ORAL | Status: DC | PRN

## 2021-04-04 MED ORDER — FENTANYL CITRATE (PF) 100 MCG/2ML IJ SOLN
50.0000 ug | INTRAMUSCULAR | Status: DC | PRN
  Filled 2021-04-04: qty 2

## 2021-04-04 MED ORDER — MISOPROSTOL 25 MCG QUARTER TABLET
25.0000 ug | ORAL_TABLET | ORAL | Status: DC | PRN
  Administered 2021-04-04 – 2021-04-05 (×5): 25 ug via VAGINAL
  Filled 2021-04-04 (×6): qty 1

## 2021-04-04 MED ORDER — ONDANSETRON HCL 4 MG/2ML IJ SOLN: 4.0000 mg | Freq: Four times a day (QID) | INTRAMUSCULAR | Status: DC | PRN

## 2021-04-04 MED ORDER — MISOPROSTOL 25 MCG QUARTER TABLET
25.0000 ug | ORAL_TABLET | Freq: Once | ORAL | Status: AC
  Administered 2021-04-04: 25 ug via BUCCAL

## 2021-04-04 MED ORDER — LACTATED RINGERS IV SOLN
500.0000 mL | INTRAVENOUS | Status: DC | PRN
  Administered 2021-04-04: 500 mL via INTRAVENOUS

## 2021-04-04 MED ORDER — LIDOCAINE HCL (PF) 1 % IJ SOLN: 30.0000 mL | INTRAMUSCULAR | Status: DC | PRN

## 2021-04-04 MED ORDER — OXYCODONE-ACETAMINOPHEN 5-325 MG PO TABS
2.0000 | ORAL_TABLET | ORAL | Status: DC | PRN
Start: 2021-04-04 — End: 2021-04-05

## 2021-04-04 MED ORDER — OXYCODONE-ACETAMINOPHEN 5-325 MG PO TABS: 1.0000 | ORAL_TABLET | ORAL | Status: DC | PRN

## 2021-04-04 MED ORDER — OXYTOCIN 10 UNIT/ML IJ SOLN
INTRAMUSCULAR | Status: AC
  Filled 2021-04-04: qty 2

## 2021-04-04 MED ORDER — AMMONIA AROMATIC IN INHA
RESPIRATORY_TRACT | Status: AC
  Filled 2021-04-04: qty 10

## 2021-04-04 MED ORDER — LIDOCAINE HCL (PF) 1 % IJ SOLN
INTRAMUSCULAR | Status: AC
  Filled 2021-04-04: qty 30

## 2021-04-04 MED ORDER — OXYTOCIN-SODIUM CHLORIDE 30-0.9 UT/500ML-% IV SOLN
1.0000 m[IU]/min | INTRAVENOUS | Status: DC
  Administered 2021-04-04 – 2021-04-05 (×2): 2 m[IU]/min via INTRAVENOUS
  Filled 2021-04-04: qty 500

## 2021-04-04 MED ORDER — ACETAMINOPHEN 325 MG PO TABS: 650.0000 mg | ORAL_TABLET | ORAL | Status: DC | PRN

## 2021-04-04 MED ORDER — OXYTOCIN-SODIUM CHLORIDE 30-0.9 UT/500ML-% IV SOLN
2.5000 [IU]/h | INTRAVENOUS | Status: DC
  Administered 2021-04-05: 1 [IU]/h via INTRAVENOUS
  Filled 2021-04-04 (×2): qty 500

## 2021-04-04 MED ORDER — TERBUTALINE SULFATE 1 MG/ML IJ SOLN: 0.2500 mg | Freq: Once | INTRAMUSCULAR | Status: DC | PRN

## 2021-04-04 MED ORDER — MISOPROSTOL 200 MCG PO TABS
ORAL_TABLET | ORAL | Status: AC
  Filled 2021-04-04: qty 4

## 2021-04-04 NOTE — H&P (Signed)
OB History & Physical   History of Present Illness:  Chief Complaint:   HPI:  Emma Murray is a 25 y.o. G1P0 female at Unknown dated by [redacted]w[redacted]d  She presents to L&D for IOL for postdates  She reports:  -active fetal movement -no leakage of fluid  -no vaginal bleeding -no contractions  Pregnancy Issues: 1. Obesity in pregnancy  2. Rubella and Varicella non-immune   Maternal Medical History:  History reviewed. No pertinent past medical history.  Past Surgical History:  Procedure Laterality Date   APPENDECTOMY     CARPAL TUNNEL RELEASE Left 09/17/2019   Procedure: CARPAL TUNNEL RELEASE;  Surgeon: MHessie Knows MD;  Location: [facility] ORS;  Service: Orthopedics;  Laterality: Left;   OPEN REDUCTION INTERNAL FIXATION (ORIF) DISTAL RADIAL FRACTURE Left 09/17/2019   Procedure: OPEN REDUCTION INTERNAL FIXATION (ORIF) DISTAL RADIAL FRACTURE;  Surgeon: MHessie Knows MD;  Location: [facility] ORS;  Service: Orthopedics;  Laterality: Left;    No Known Allergies  Prior to Admission medications   Medication Sig Start Date End Date Taking? Authorizing Provider  hydrOXYzine (ATARAX/VISTARIL) 25 MG tablet Take 1 tablet (25 mg total) by mouth every 6 (six) hours as needed for anxiety. 11/28/19   NLindell SparI, NP  sertraline (ZOLOFT) 25 MG tablet Take 1 tablet (25 mg total) by mouth daily. For depression 11/29/19   NLindell SparI, NP  traZODone (DESYREL) 50 MG tablet Take 1 tablet (50 mg total) by mouth at bedtime as needed for sleep. 11/28/19   NEncarnacion Slates NP     Prenatal care site: [facility]  Social History: She  reports that she has never smoked. She has never used smokeless tobacco. She reports that she does not currently use alcohol. She reports that she does not use drugs.  Family History: family history is not on file.   Review of Systems: A full review of systems was performed and negative except as noted in the HPI.    Physical Exam:  Vital Signs: BP 119/73   Pulse 91    Temp 98.7 F (37.1 C) (Oral)   Resp 16   Ht '5\' 3"'$  (1.6 m)   Wt 112 kg   BMI 43.75 kg/m   General:   alert and cooperative  Skin:  normal  Neurologic:    Alert & oriented x 3  Lungs:    Nl effort  Heart:   regular rate and rhythm  Abdomen:  soft, non-tender; bowel sounds normal; no masses,  no organomegaly  Extremities: : non-tender, symmetric, no edema bilaterally.      Pertinent Results:  Prenatal Labs: Blood type/Rh O pos  Antibody screen neg  Rubella Non-Immune  Varicella Non-Immune  RPR NR  HBsAg Neg  HIV NR  GC neg  Chlamydia neg  Genetic screening negative  1 hour GTT 133  3 hour GTT    GBS neg   FHT: FHR: 130 bpm, variability: moderate,  accelerations:  Present,  decelerations:  Absent Category/reactivity:  Category I TOCO: irregular SVE: Dilation: Closed /   / Station: Ballotable     Assessment:  Emma Murray is a 25y.o. G1P0 female at 422w0dith post dates pregnancy.   Plan:  1. Admit to Labor & Delivery; consents reviewed and obtained  2. Fetal Well being  - Fetal Tracing: Cat I - GBS neg - Presentation: vtx confirmed by sve   3. Routine OB: - Prenatal labs reviewed, as above - Rh pos - CBC &  T&S on admit - Clear fluids, IVF  4. Induction of Labor -  Contractions by external toco in place -  Plan for induction with Cytotec, AROM, and Pitocin -  Plan for continuous fetal monitoring  -  Maternal pain control as desired: IVPM, nitrous, regional anesthesia - Anticipate vaginal delivery  5. Post Partum Planning: - Infant feeding: Breastfeeding - Contraception: Depo - Tdap declined - Flu declined  Emma Murray, CNM 04/04/2021 2:59 AM

## 2021-04-04 NOTE — Progress Notes (Signed)
Labor Progress Note  Emma Murray is a 25 y.o. G1P0 at 64w0dby ultrasound admitted for induction of labor due to Post dates. Due date 03/28/21.  Subjective: Second dose of Cytotec completed. RN notified me of late decels - able to resolve with intrauterine resuscitation.   Objective: BP (!) 102/50   Pulse (!) 101   Temp 98.1 F (36.7 C) (Oral)   Resp 18   Ht '5\' 3"'$  (1.6 m)   Wt 112 kg   LMP 06/19/2020   BMI 43.75 kg/m    Fetal Assessment: FHT:  FHR: 150 bpm, variability: moderate,  accelerations:  Present,  decelerations:  Present 3 lates Category/reactivity:  Category II UC:   irregular, every 4-5 minutes but not feeling them SVE:   deferred Membrane status: Intact Amniotic color: n/a  Labs: Lab Results  Component Value Date   WBC 12.3 (H) 04/04/2021   HGB 12.3 04/04/2021   HCT 35.4 (L) 04/04/2021   MCV 88.3 04/04/2021   PLT 284 04/04/2021    Assessment / Plan: Induction of labor due to postterm 0125 Cytotec PV Closed and Ballotable 0525 Cytotec PV 1cm and -3 1030 2/70/-2 - allowed to eat then start Pitocin   Labor: Progressing normally Preeclampsia:   95/47 Fetal Wellbeing:  Category II resolved with moderate variability and accels Pain Control:  Labor support without medications I/D:   Afebrile, GBS neg, Intact Anticipated MOD:  NSVD  JClydene Laming CNM 04/04/2021, 6:10 PM

## 2021-04-04 NOTE — Progress Notes (Signed)
Labor Progress Note  Emma Murray is a 25 y.o. G1P0 at 42w0dby ultrasound admitted for induction of labor due to Post dates. Due date 03/28/21.  Subjective: Per RN report, despite 15mof pitocin she is not feeling any UCs.  Objective: BP (!) 102/50   Pulse (!) 101   Temp 98.1 F (36.7 C) (Oral)   Resp 18   Ht '5\' 3"'$  (1.6 m)   Wt 112 kg   LMP 06/19/2020   BMI 43.75 kg/m    Fetal Assessment: FHT:  FHR: 145 bpm, variability: moderate,  accelerations:  Present,  decelerations:  Absent Category/reactivity:  Category I UC:   regular, every 2-4 minutes but not feeling them SVE:   deferred Membrane status:Intact Amniotic color: n/a  Labs: Lab Results  Component Value Date   WBC 12.3 (H) 04/04/2021   HGB 12.3 04/04/2021   HCT 35.4 (L) 04/04/2021   MCV 88.3 04/04/2021   PLT 284 04/04/2021    Assessment / Plan: Induction of labor due to postterm 0125 Cytotec PV Closed and Ballotable 0525 Cytotec PV 1cm and -3 1030 2/70/-2 - allowed to eat then start Pitocin 1200 - 1638 Pitocin increased to 1868mnd stopped, pt reports feeling just slight cramping on occasion  Labor: Progressing normally Preeclampsia:   129/74 Fetal Wellbeing:  Category I Pain Control:  Labor support without medications I/D:   Afebrile, GBS neg, Intact Anticipated MOD:  NSVD  JenClydene LamingNM 04/04/2021, 5:56 PM

## 2021-04-04 NOTE — Progress Notes (Signed)
Labor Progress Note  Emma Murray is a 25 y.o. G1P0 at 73w0dby ultrasound admitted for induction of labor due to Post dates. Due date 03/28/21.  Subjective: She is not feeling any UCs, just a tightening feeling  Objective: BP 119/69 (BP Location: Left Arm)   Pulse 97   Temp 98 F (36.7 C) (Oral)   Resp 16   Ht '5\' 3"'$  (1.6 m)   Wt 112 kg   LMP 06/19/2020   BMI 43.75 kg/m    Fetal Assessment: FHT:  FHR: 150 bpm, variability: moderate,  accelerations:  Present,  decelerations:  Absent Category/reactivity:  Category I UC:   regular, every 3-6 minutes but not feeling them SVE:   see below Membrane status:Intact Amniotic color: n/a  Labs: Lab Results  Component Value Date   WBC 12.3 (H) 04/04/2021   HGB 12.3 04/04/2021   HCT 35.4 (L) 04/04/2021   MCV 88.3 04/04/2021   PLT 284 04/04/2021    Assessment / Plan: Induction of labor due to postterm 0125 Cytotec PV, Closed and Ballotable 0525 Cytotec PV, 1cm and -3 1030 2/70/-2 - allowed to eat then start Pitocin 1200 - 1638 Pitocin increased to 12mand stopped, pt reports feeling just slight cramping on occasion 1800 - allowed to eat 1859 - Cytotec PV and buccal, 3/70/-2   Labor: Progressing normally Preeclampsia:   119/69 Fetal Wellbeing:  Category I Pain Control:  Labor support without medications I/D:   Afebrile, GBS neg, Intact Anticipated MOD:  NSVD  JeClydene LamingCNM 04/04/2021, 7:09 PM

## 2021-04-05 ENCOUNTER — Inpatient Hospital Stay: Payer: Medicaid Other | Admitting: Anesthesiology

## 2021-04-05 ENCOUNTER — Encounter
Admission: EM | Disposition: A | Discharge status: Home / Self Care | Payer: Self-pay | Source: Home / Self Care | Attending: Obstetrics and Gynecology

## 2021-04-05 DIAGNOSIS — O368330 Maternal care for abnormalities of the fetal heart rate or rhythm, third trimester, not applicable or unspecified: Secondary | ICD-10-CM

## 2021-04-05 DIAGNOSIS — Z3A41 41 weeks gestation of pregnancy: Secondary | ICD-10-CM

## 2021-04-05 DIAGNOSIS — O480 Post-term pregnancy: Principal | ICD-10-CM

## 2021-04-05 DIAGNOSIS — O99214 Obesity complicating childbirth: Secondary | ICD-10-CM

## 2021-04-05 SURGERY — Surgical Case
Anesthesia: Epidural

## 2021-04-05 MED ORDER — BUPIVACAINE HCL (PF) 0.25 % IJ SOLN
INTRAMUSCULAR | Status: DC | PRN
  Administered 2021-04-05: 2 mL via EPIDURAL
  Administered 2021-04-05: 4 mL via EPIDURAL

## 2021-04-05 MED ORDER — BUPIVACAINE HCL (PF) 0.25 % IJ SOLN
INTRAMUSCULAR | Status: AC
  Filled 2021-04-05: qty 30

## 2021-04-05 MED ORDER — BUPIVACAINE HCL (PF) 0.5 % IJ SOLN: 30.0000 mL | Freq: Once | INTRAMUSCULAR | Status: DC

## 2021-04-05 MED ORDER — FENTANYL-BUPIVACAINE-NACL 0.5-0.125-0.9 MG/250ML-% EP SOLN
12.0000 mL/h | EPIDURAL | Status: DC | PRN
  Administered 2021-04-05: 12 mL/h via EPIDURAL

## 2021-04-05 MED ORDER — ONDANSETRON HCL 4 MG/2ML IJ SOLN
INTRAMUSCULAR | Status: DC | PRN
  Administered 2021-04-05: 4 mg via INTRAVENOUS

## 2021-04-05 MED ORDER — SOD CITRATE-CITRIC ACID 500-334 MG/5ML PO SOLN: 30.0000 mL | ORAL | Status: DC

## 2021-04-05 MED ORDER — CEFAZOLIN SODIUM-DEXTROSE 2-4 GM/100ML-% IV SOLN
INTRAVENOUS | Status: AC
  Filled 2021-04-05: qty 100

## 2021-04-05 MED ORDER — HYDROMORPHONE HCL 1 MG/ML IJ SOLN
INTRAMUSCULAR | Status: AC
  Filled 2021-04-05: qty 1

## 2021-04-05 MED ORDER — PHENYLEPHRINE 40 MCG/ML (10ML) SYRINGE FOR IV PUSH (FOR BLOOD PRESSURE SUPPORT): 80.0000 ug | PREFILLED_SYRINGE | INTRAVENOUS | Status: DC | PRN

## 2021-04-05 MED ORDER — TRANEXAMIC ACID-NACL 1000-0.7 MG/100ML-% IV SOLN
INTRAVENOUS | Status: AC
  Filled 2021-04-05: qty 100

## 2021-04-05 MED ORDER — FENTANYL CITRATE (PF) 100 MCG/2ML IJ SOLN
INTRAMUSCULAR | Status: DC | PRN
  Administered 2021-04-05: 100 ug via EPIDURAL

## 2021-04-05 MED ORDER — EPHEDRINE 5 MG/ML INJ
10.0000 mg | INTRAVENOUS | Status: DC | PRN
  Filled 2021-04-05: qty 4

## 2021-04-05 MED ORDER — PROPOFOL 10 MG/ML IV BOLUS
INTRAVENOUS | Status: AC
  Filled 2021-04-05: qty 20

## 2021-04-05 MED ORDER — HYDROMORPHONE HCL 1 MG/ML IJ SOLN
INTRAMUSCULAR | Status: DC | PRN
  Administered 2021-04-05: .5 mg via EPIDURAL

## 2021-04-05 MED ORDER — SODIUM CHLORIDE 0.9 % IV SOLN
INTRAVENOUS | Status: DC | PRN
  Administered 2021-04-05: 50 ug/min via INTRAVENOUS

## 2021-04-05 MED ORDER — FENTANYL CITRATE (PF) 100 MCG/2ML IJ SOLN
INTRAMUSCULAR | Status: AC
  Filled 2021-04-05: qty 2

## 2021-04-05 MED ORDER — EPHEDRINE 5 MG/ML INJ: 10.0000 mg | INTRAVENOUS | Status: DC | PRN

## 2021-04-05 MED ORDER — LIDOCAINE HCL (PF) 1 % IJ SOLN
INTRAMUSCULAR | Status: DC | PRN
  Administered 2021-04-05: 2 mL
  Administered 2021-04-05: 3 mL

## 2021-04-05 MED ORDER — BUPIVACAINE LIPOSOME 1.3 % IJ SUSP
20.0000 mL | Freq: Once | INTRAMUSCULAR | Status: DC
  Filled 2021-04-05: qty 20

## 2021-04-05 MED ORDER — LIDOCAINE-EPINEPHRINE (PF) 1.5 %-1:200000 IJ SOLN
INTRAMUSCULAR | Status: DC | PRN
  Administered 2021-04-05: 3 mL via PERINEURAL

## 2021-04-05 MED ORDER — CEFAZOLIN SODIUM-DEXTROSE 2-4 GM/100ML-% IV SOLN
2.0000 g | INTRAVENOUS | Status: AC
Start: 2021-04-05 — End: 2021-04-05
  Administered 2021-04-05: 2 g via INTRAVENOUS

## 2021-04-05 MED ORDER — SODIUM CHLORIDE 0.9% FLUSH
50.0000 mL | Freq: Once | INTRAVENOUS | Status: DC
Start: 2021-04-05 — End: 2021-04-06
  Filled 2021-04-05: qty 51

## 2021-04-05 MED ORDER — LIDOCAINE HCL (PF) 2 % IJ SOLN
INTRAMUSCULAR | Status: DC | PRN
  Administered 2021-04-05: 60 mg via EPIDURAL
  Administered 2021-04-05 (×2): 100 mg via EPIDURAL
  Administered 2021-04-05: 40 mg via EPIDURAL

## 2021-04-05 MED ORDER — SODIUM CHLORIDE 0.9 % IV SOLN
500.0000 mg | Freq: Once | INTRAVENOUS | Status: AC
  Administered 2021-04-05: 500 mg via INTRAVENOUS

## 2021-04-05 MED ORDER — BUPIVACAINE LIPOSOME 1.3 % IJ SUSP
INTRAMUSCULAR | Status: AC
  Filled 2021-04-05: qty 20

## 2021-04-05 MED ORDER — SODIUM CHLORIDE (PF) 0.9 % IJ SOLN
INTRAMUSCULAR | Status: AC
  Filled 2021-04-05: qty 50

## 2021-04-05 MED ORDER — FENTANYL-BUPIVACAINE-NACL 0.5-0.125-0.9 MG/250ML-% EP SOLN
EPIDURAL | Status: AC
  Filled 2021-04-05: qty 250

## 2021-04-05 MED ORDER — DIPHENHYDRAMINE HCL 50 MG/ML IJ SOLN: 12.5000 mg | INTRAMUSCULAR | Status: DC | PRN

## 2021-04-05 MED ORDER — TRANEXAMIC ACID-NACL 1000-0.7 MG/100ML-% IV SOLN
1000.0000 mg | INTRAVENOUS | Status: AC
  Administered 2021-04-05: 1000 mg via INTRAVENOUS

## 2021-04-05 MED ORDER — BUPIVACAINE HCL (PF) 0.5 % IJ SOLN
INTRAMUSCULAR | Status: AC
  Filled 2021-04-05: qty 30

## 2021-04-05 MED ORDER — SOD CITRATE-CITRIC ACID 500-334 MG/5ML PO SOLN
ORAL | Status: AC
  Filled 2021-04-05: qty 15

## 2021-04-05 MED ORDER — PHENYLEPHRINE HCL (PRESSORS) 10 MG/ML IV SOLN
INTRAVENOUS | Status: DC | PRN
  Administered 2021-04-05: 100 ug via INTRAVENOUS

## 2021-04-05 MED ORDER — SODIUM CHLORIDE 0.9 % IV SOLN
INTRAVENOUS | Status: AC
  Filled 2021-04-05: qty 500

## 2021-04-05 MED ORDER — LACTATED RINGERS IV SOLN
500.0000 mL | Freq: Once | INTRAVENOUS | Status: AC
  Administered 2021-04-05: 500 mL via INTRAVENOUS

## 2021-04-05 SURGICAL SUPPLY — 30 items
BAG COUNTER SPONGE SURGICOUNT (BAG) ×2 IMPLANT
BANDAGE ELASTIC 6 VELCRO ST LF (GAUZE/BANDAGES/DRESSINGS) ×2 IMPLANT
CELL SAVER LIPIGURD (MISCELLANEOUS) ×1 IMPLANT
CHLORAPREP W/TINT 26 (MISCELLANEOUS) ×4 IMPLANT
DRSG TELFA 3X8 NADH (GAUZE/BANDAGES/DRESSINGS) ×2 IMPLANT
ELECT REM PT RETURN 9FT ADLT (ELECTROSURGICAL) ×2
ELECTRODE REM PT RTRN 9FT ADLT (ELECTROSURGICAL) ×1 IMPLANT
EXTRT SYSTEM ALEXIS 14CM (MISCELLANEOUS) ×2
EXTRT SYSTEM ALEXIS 17CM (MISCELLANEOUS)
GAUZE SPONGE 4X4 12PLY STRL (GAUZE/BANDAGES/DRESSINGS) ×2 IMPLANT
GLOVE SURG ENC MOIS LTX SZ6.5 (GLOVE) ×2 IMPLANT
GLOVE SURG UNDER LTX SZ7 (GLOVE) ×2 IMPLANT
GOWN STRL REUS W/ TWL LRG LVL3 (GOWN DISPOSABLE) ×2 IMPLANT
GOWN STRL REUS W/TWL LRG LVL3 (GOWN DISPOSABLE) ×2
KIT TURNOVER KIT A (KITS) ×2 IMPLANT
MANIFOLD NEPTUNE II (INSTRUMENTS) ×2 IMPLANT
MAT PREVALON FULL STRYKER (MISCELLANEOUS) ×2 IMPLANT
NS IRRIG 1000ML POUR BTL (IV SOLUTION) ×2 IMPLANT
PACK C SECTION AR (MISCELLANEOUS) ×2 IMPLANT
PAD OB MATERNITY 4.3X12.25 (PERSONAL CARE ITEMS) ×2 IMPLANT
PAD PREP 24X41 OB/GYN DISP (PERSONAL CARE ITEMS) ×2 IMPLANT
PENCIL SMOKE EVACUATOR (MISCELLANEOUS) ×2 IMPLANT
SCRUB EXIDINE 4% CHG 4OZ (MISCELLANEOUS) ×2 IMPLANT
SUT MNCRL AB 4-0 PS2 18 (SUTURE) ×2 IMPLANT
SUT PLAIN 2 0 XLH (SUTURE) IMPLANT
SUT VIC AB 0 CT1 36 (SUTURE) ×8 IMPLANT
SUT VIC AB 3-0 SH 27 (SUTURE) ×1
SUT VIC AB 3-0 SH 27X BRD (SUTURE) ×1 IMPLANT
SYSTEM CONTND EXTRCTN KII BLLN (MISCELLANEOUS) IMPLANT
WATER STERILE IRR 500ML POUR (IV SOLUTION) ×2 IMPLANT

## 2021-04-05 NOTE — Anesthesia Procedure Notes (Addendum)
Epidural Patient location during procedure: OB Start time: 04/05/2021 4:34 PM End time: 04/05/2021 4:48 PM  Staffing Anesthesiologist: Martha Clan, MD Resident/CRNA: Norm Salt, CRNA Performed: resident/CRNA   Preanesthetic Checklist Completed: patient identified, IV checked, site marked, risks and benefits discussed, surgical consent, monitors and equipment checked, pre-op evaluation and timeout performed  Epidural Patient position: sitting Prep: ChloraPrep Patient monitoring: heart rate, continuous pulse ox and blood pressure Approach: midline Location: L3-L4 Injection technique: LOR saline  Needle:  Needle type: Tuohy  Needle gauge: 17 G Needle length: 9 cm and 9 Needle insertion depth: 8 cm Catheter type: closed end flexible Catheter size: 19 Gauge Catheter at skin depth: 12 cm Test dose: negative and 1.5% lidocaine with Epi 1:200 K  Assessment Sensory level: T10 Events: blood not aspirated, injection not painful, no injection resistance, no paresthesia and negative IV test  Additional Notes 2 attempt Pt. Evaluated and documentation done after procedure finished. Patient identified. Risks/Benefits/Options discussed with patient including but not limited to bleeding, infection, nerve damage, paralysis, failed block, incomplete pain control, headache, blood pressure changes, nausea, vomiting, reactions to medication both or allergic, itching and postpartum back pain. Confirmed with bedside nurse the patient's most recent platelet count. Confirmed with patient that they are not currently taking any anticoagulation, have any bleeding history or any family history of bleeding disorders. Patient expressed understanding and wished to proceed. All questions were answered. Sterile technique was used throughout the entire procedure. Please see nursing notes for vital signs. Test dose was given through epidural catheter and negative prior to continuing to dose epidural or start  infusion. Warning signs of high block given to the patient including shortness of breath, tingling/numbness in hands, complete motor block, or any concerning symptoms with instructions to call for help. Patient was given instructions on fall risk and not to get out of bed. All questions and concerns addressed with instructions to call with any issues or inadequate analgesia.    Patient tolerated the insertion well without immediate complications.Reason for block:procedure for pain

## 2021-04-05 NOTE — Anesthesia Preprocedure Evaluation (Signed)
Anesthesia Evaluation  Patient identified by MRN, date of birth, ID band Patient awake    Reviewed: Allergy & Precautions, H&P , NPO status , Patient's Chart, lab work & pertinent test results  Airway Mallampati: II   Neck ROM: full    Dental no notable dental hx.    Pulmonary neg pulmonary ROS,    Pulmonary exam normal        Cardiovascular negative cardio ROS Normal cardiovascular exam     Neuro/Psych PSYCHIATRIC DISORDERS Depression negative neurological ROS     GI/Hepatic negative GI ROS, Neg liver ROS,   Endo/Other  negative endocrine ROS  Renal/GU negative Renal ROS  negative genitourinary   Musculoskeletal   Abdominal   Peds  Hematology negative hematology ROS (+)   Anesthesia Other Findings   Reproductive/Obstetrics (+) Pregnancy                             Anesthesia Physical Anesthesia Plan  ASA: 2  Anesthesia Plan: Epidural   Post-op Pain Management:    Induction:   PONV Risk Score and Plan:   Airway Management Planned:   Additional Equipment:   Intra-op Plan:   Post-operative Plan:   Informed Consent:   Plan Discussed with: Anesthesiologist and CRNA  Anesthesia Plan Comments:         Anesthesia Quick Evaluation

## 2021-04-05 NOTE — Progress Notes (Signed)
Sheppard Penton, CNM at bedside consenting patient for c-section discussing risks and benefits. I RN at bedside to assist as needed.

## 2021-04-05 NOTE — Progress Notes (Signed)
Labor Progress Note  Emma Murray is a 25 y.o. G1P0 at 66w1dby ultrasound admitted for induction of labor due to Post dates. Due date 03/28/21.  Subjective: Reports contractions are 3/10, having to breathe through them.  Objective: BP 131/75 (BP Location: Right Arm)   Pulse 77   Temp 98.2 F (36.8 C) (Oral)   Resp 16   Ht '5\' 3"'$  (1.6 m)   Wt 112 kg   LMP 06/19/2020   BMI 43.75 kg/m  Notable VS details: reviewed.  Fetal Assessment: FHT:  FHR: 150 bpm, variability: moderate,  accelerations:  Present,  decelerations:  Absent Category/reactivity:  Category I UC:   regular, she is breathing through them SVE:    Dilation: 5  Effacement: 90%  Station:  -1  Consistency: soft  Position: middle  Membrane status:AROM Amniotic color: clear  Labs: Lab Results  Component Value Date   WBC 12.3 (H) 04/04/2021   HGB 12.3 04/04/2021   HCT 35.4 (L) 04/04/2021   MCV 88.3 04/04/2021   PLT 284 04/04/2021    Assessment / Plan: Induction of labor due to postterm,  progressing well on pitocin. IUPC placed for contraction monitoring.  Labor: Progressing normally Preeclampsia:   none Fetal Wellbeing:  Category I Pain Control:  Labor support without medications I/D:  n/a Anticipated MOD:  NSVD  DSt. George CNM 04/05/2021, 3:25 PM

## 2021-04-05 NOTE — Op Note (Signed)
Cesarean Section Procedure Note  Indications: failure to progress: arrest of dilation and non-reassuring fetal tracing  Pre-operative Diagnosis: 41 week 1 day pregnancy, failure to progress (arrest of dilation), non-reassuring fetal tracing (repetitive late decelerations), obesity in pregnancy (BMI 43).  Post-operative Diagnosis: Same, with small subserosal fibroid  Surgeon: Rubie Maid, MD  Assistants:   Hassan Buckler, CNM.  An experienced assistant was required given the standard of surgical care given the complexity of the case.  This assistant was needed for exposure, dissection, suctioning, retraction, instrument exchange, and for overall help during the procedure.  Procedure: Primary low transverse Cesarean Section  Anesthesia: Spinal anesthesia  Findings: Female infant, cephalic presentation, 123XX123 grams, with Apgar scores of 8 at one minute and 9 at five minutes. Intact placenta with 3 vessel cord.  Clear amniotic fluid at amniotomy, scant fluid as patient had been previously artificially ruptured. The uterine outline, tubes and ovaries appeared normal. Small subserosal fibroid on anterior surface of the uterus.   Procedure Details: The patient was seen in the Holding Room. The risks, benefits, complications, treatment options, and expected outcomes were discussed with the patient.  The patient concurred with the proposed plan, giving informed consent.  The site of surgery properly noted/marked. The patient was taken to the Operating Room, identified as Emma Murray and the procedure verified as C-Section Delivery.   After induction of anesthesia, the patient was draped and prepped in the usual sterile manner. Anesthesia was tested and noted to be adequate. A Time Out was held and the above information confirmed.  A Pfannenstiel incision was made and carried down through the subcutaneous tissue to the fascia. Fascial incision was made and extended transversely. The fascia was  separated from the underlying rectus tissue superiorly and inferiorly. The peritoneum was identified and entered. Peritoneal incision was extended longitudinally. The surgical assist was able to provide retraction to allow for clear visualization of surgical site. The utero-vesical peritoneal reflection was incised transversely and the bladder flap was bluntly freed from the lower uterine segment. A low transverse uterine incision was made. Delivered from cephalic (left OP) presentation was a 3510 gram Female with Apgar scores of 8 at one minute and 9 at five minutes.  The assistant was able to apply adequate fundal pressure to allow for successful delivery of the fetus. After the umbilical cord was clamped and cut, cord blood was obtained for evaluation. The placenta was removed intact and appeared normal. The uterus was exteriorized and cleared of all clots and debris. The uterine outline, tubes and ovaries appeared normal with the exception of a small subserosal fibroid on anterior surface.  The uterine incision was closed with running locked sutures of 0-Vicryl.  The uterine tone was noted to be persistently boggy, so 1 gram of tranexamic acid was administered intravenously. A second suture of 0-Vicryl was used in an imbricating layer.  Hemostasis was observed and uterine tone was noted to firm. The uterus was then returned to the abdomen. The pericolic gutters were cleared of all clots and debris. The fascia was then grasped with Fransisca Kaufmann and Claiborne Billings clamps, and injected with a total of 60 ml of 1.3% Exparel solution (20 ml of bupivicaine liposomal mixed with 30 ml of 0.5% Marcaine and diluted in 50 ml of normal saline).  The fascia was then reapproximated with a running suture of 0-Vicryl. The subcutaneous fat layer was reapproximated with 2-0 Vicryl. The skin was reapproximated with 4-0 Monocryl. The skin and subcutaneous tissues were then injected with  an additional 40 ml of the Exparel solution. The incision  was covered with steri-strips and a pressure dressing.   Instrument, sponge, and needle counts were correct prior the abdominal closure and at the conclusion of the case.    Estimated Blood Loss:  585 ml      Drains: foley catheter to gravity drainage, 125 ml of clear urine at end of the procedure         Total IV Fluids:  850 ml  Specimens: Cord blood collected for evaluation         Implants: None         Complications:  None; patient tolerated the procedure well.         Disposition: PACU - hemodynamically stable.         Condition: stable   Rubie Maid, MD [facility]

## 2021-04-05 NOTE — Progress Notes (Signed)
  Called by midwife, Hassan Buckler, CNM due to arrest of dilation and Category II fetal tracing.  Patient is a 25 y.o. G1P0 female at 98w1dwho presented yesterday morning for scheduled IOL for postdates. Had uneventful labor course until now, with arrest of dilation at 6 cm since 1530 pm. Began having recurrent late decelerations, not resolved with position changes but did resolve with discontinuation of Pitocin.  MVU's have been adequate.   The risks of surgery were discussed with the patient including but were not limited to: bleeding which may require transfusion or reoperation; infection which may require antibiotics; injury to bowel, bladder, ureters or other surrounding organs; injury to the fetus; need for additional procedures including hysterectomy in the event of a life-threatening hemorrhage; formation of adhesions; placental abnormalities with subsequent pregnancies; incisional problems; thromboembolic phenomenon and other postoperative/anesthesia complications.  The patient concurred with the proposed plan, giving informed written consent for the procedure.   Patient has been NPO since yesterday except clears, she will remain NPO for procedure. Anesthesia and OR aware. Preoperative prophylactic antibiotics and SCDs ordered on call to the OR.  To OR when ready.   CRubie Maid MD [facility]

## 2021-04-05 NOTE — Progress Notes (Signed)
Labor Progress Note  Emma Murray is a 25 y.o. G1P0 at 52w1dby ultrasound admitted for induction of labor due to Post dates. Due date 03/28/21.  Subjective: feeling mild cramping only.   Objective: BP 119/73 (BP Location: Right Arm)   Pulse 85   Temp 98.1 F (36.7 C) (Oral)   Resp 18   Ht '5\' 3"'$  (1.6 m)   Wt 112 kg   LMP 06/19/2020   BMI 43.75 kg/m  Notable VS details: reviewed.   Fetal Assessment: FHT:  FHR: 145 bpm, variability: moderate,  accelerations:  Present,  decelerations:  Absent Category/reactivity:  Category I UC:   irregular, every 3-7 minutes, palpate mild.  SVE:   4/50/-2, soft, midposition.  - AROM performed, scant clear fluid.   Membrane status: AROM at 0828 Amniotic color: clear  Labs: Lab Results  Component Value Date   WBC 12.3 (H) 04/04/2021   HGB 12.3 04/04/2021   HCT 35.4 (L) 04/04/2021   MCV 88.3 04/04/2021   PLT 284 04/04/2021    Assessment / Plan: IOL at 41.1wks for late term. Prolonged latent phase.   Labor: s/p Cytotec and pitocin yesterday, repeat cytotec overnight. Now AROM, will start Pitocin if inadequate UC pattern in 1 hour.  Preeclampsia:   no e/o Pre-E Fetal Wellbeing:  Category I Pain Control:  Labor support without medications; does not want epidural, plans IVPM.  I/D:  n/a Anticipated MOD:  NSVD  RMurray HodgkinsMcVey, CNM 04/05/2021, 8:38 AM

## 2021-04-05 NOTE — Progress Notes (Signed)
Labor Progress Note  Emma Murray is a 25 y.o. G1P0 at 62w1dby ultrasound admitted for induction of labor due to Post dates. Due date 03/28/21.  Subjective: comfortable after epidural  Objective: BP 122/65 (BP Location: Right Arm)   Pulse (!) 102   Temp 98.3 F (36.8 C) (Oral)   Resp 18   Ht '5\' 3"'$  (1.6 m)   Wt 112 kg   LMP 06/19/2020   SpO2 98%   BMI 43.75 kg/m  Notable VS details: reviewed.  Fetal Assessment: FHT:  FHR: 150 bpm, variability: moderate,  accelerations:  Present,  decelerations:  Present variable and late decels intermittently. Category/reactivity:  Category I UC:   q3-586m, IUPC in place, MVUs 210-230 SVE:   6/100/-1, soft/anterior. Normal appearing bloody show, small amt clear fluid. Some caput noted.   Membrane status:AROM at 0828 Amniotic color: clear  Labs: Lab Results  Component Value Date   WBC 12.3 (H) 04/04/2021   HGB 12.3 04/04/2021   HCT 35.4 (L) 04/04/2021   MCV 88.3 04/04/2021   PLT 284 04/04/2021    Assessment / Plan: Induction of labor due to postterm,  progressing well on pitocin. IUPC placed for contraction monitoring.  Labor: s/p cytotec, PItocin and Cytotec. AROM this AM at 0828, now progressing with IUPC and on Pitocin.  Preeclampsia:   none Fetal Wellbeing:  Category II, maternal position changed, will monitor closely.  Pain Control:  Epidural I/D:  n/a Anticipated MOD:  NSVD   ReFrancetta FoundCNM 04/05/2021, 6:59 PM

## 2021-04-05 NOTE — Progress Notes (Signed)
Labor Progress Note  Emma Murray is a 25 y.o. G1P0 at 73w1dby ultrasound admitted for induction of labor due to Post dates. Due date 03/28/21.  Subjective: comfortable after epidural  Objective: BP 122/65 (BP Location: Right Arm)   Pulse (!) 102   Temp 98.3 F (36.8 C) (Oral)   Resp 18   Ht '5\' 3"'$  (1.6 m)   Wt 112 kg   LMP 06/19/2020   SpO2 98%   BMI 43.75 kg/m  Notable VS details: reviewed.  Fetal Assessment: FHT:  FHR: 160 bpm, variability: moderate,  accelerations:  Present,  decelerations:  Present recurrent late decels  Category/reactivity:  Category I UC:   q3-545m, IUPC in place, MVUs 210-250, Pitocin at 1421min, decreased to 60m36mn and position changed.  SVE:   6/100/-1, soft/anterior. Normal appearing bloody show, small amt clear fluid. Some caput noted.   Membrane status:AROM at 0828 Amniotic color: clear  Labs: Lab Results  Component Value Date   WBC 12.3 (H) 04/04/2021   HGB 12.3 04/04/2021   HCT 35.4 (L) 04/04/2021   MCV 88.3 04/04/2021   PLT 284 04/04/2021    Assessment / Plan: Induction of labor due to postterm,  progressing well on pitocin. IUPC placed for contraction monitoring.  Labor: s/p cytotec, Pitocin and Cytotec on IOL day 1, AROM this AM at 0828, Progressed to active labor but no cervical change in 5hrs and now with persistent late decels.  - Dr CherMarcelline Matesled, notified of FHR tracing, cervical exam. Discussed with pt and partner, Will proceed to low transverse CS.  Preeclampsia:   none Fetal Wellbeing:  Category II, recurrent lates.  Pain Control:  Epidural I/D:  n/a Anticipated MOD:  primary LTCS   RebeFrancetta FoundM 04/05/2021, 8:24 PM

## 2021-04-05 NOTE — Transfer of Care (Signed)
Immediate Anesthesia Transfer of Care Note  Patient: Emma Murray  Procedure(s) Performed: CESAREAN SECTION  Patient Location: PACU  Anesthesia Type:Epidural  Level of Consciousness: awake, alert , oriented and patient cooperative  Airway & Oxygen Therapy: Patient Spontanous Breathing  Post-op Assessment: Report given to RN and Post -op Vital signs reviewed and stable  Post vital signs: Reviewed and stable  Last Vitals:  Vitals Value Taken Time  BP    Temp    Pulse    Resp    SpO2      Last Pain:  Vitals:   04/05/21 1730  TempSrc: Oral  PainSc:       Patients Stated Pain Goal: 0 ([phone] [phone])  Complications: No notable events documented.

## 2021-04-06 ENCOUNTER — Encounter: Payer: Self-pay | Admitting: Obstetrics and Gynecology

## 2021-04-06 LAB — CBC
HCT: 29.8 % — ABNORMAL LOW (ref 36.0–46.0)
Hemoglobin: 10.4 g/dL — ABNORMAL LOW (ref 12.0–15.0)
MCH: 31.2 pg (ref 26.0–34.0)
MCHC: 34.9 g/dL (ref 30.0–36.0)
MCV: 89.5 fL (ref 80.0–100.0)
Platelets: 243 10*3/uL (ref 150–400)
RBC: 3.33 MIL/uL — ABNORMAL LOW (ref 3.87–5.11)
RDW: 13.2 % (ref 11.5–15.5)
WBC: 17 10*3/uL — ABNORMAL HIGH (ref 4.0–10.5)
nRBC: 0 % (ref 0.0–0.2)

## 2021-04-06 MED ORDER — VARICELLA VIRUS VACCINE LIVE 1350 PFU/0.5ML IJ SUSR
0.5000 mL | INTRAMUSCULAR | Status: AC | PRN
  Administered 2021-04-07: 0.5 mL via SUBCUTANEOUS
  Filled 2021-04-06 (×2): qty 0.5

## 2021-04-06 MED ORDER — LACTATED RINGERS IV SOLN: INTRAVENOUS | Status: DC

## 2021-04-06 MED ORDER — METHYLERGONOVINE MALEATE 0.2 MG PO TABS: 0.2000 mg | ORAL_TABLET | ORAL | Status: DC | PRN

## 2021-04-06 MED ORDER — GABAPENTIN 300 MG PO CAPS
300.0000 mg | ORAL_CAPSULE | Freq: Two times a day (BID) | ORAL | Status: DC
  Administered 2021-04-06 – 2021-04-07 (×3): 300 mg via ORAL
  Filled 2021-04-06 (×3): qty 1

## 2021-04-06 MED ORDER — DIBUCAINE (PERIANAL) 1 % EX OINT: 1.0000 "application " | TOPICAL_OINTMENT | CUTANEOUS | Status: DC | PRN

## 2021-04-06 MED ORDER — MEASLES, MUMPS & RUBELLA VAC IJ SOLR
0.5000 mL | INTRAMUSCULAR | Status: AC | PRN
  Administered 2021-04-07: 0.5 mL via SUBCUTANEOUS
  Filled 2021-04-06 (×2): qty 0.5

## 2021-04-06 MED ORDER — FERROUS SULFATE 325 (65 FE) MG PO TABS
325.0000 mg | ORAL_TABLET | ORAL | Status: DC
  Administered 2021-04-06: 325 mg via ORAL
  Filled 2021-04-06: qty 1

## 2021-04-06 MED ORDER — IBUPROFEN 600 MG PO TABS
600.0000 mg | ORAL_TABLET | Freq: Four times a day (QID) | ORAL | Status: DC
  Administered 2021-04-07 (×2): 600 mg via ORAL
  Filled 2021-04-06 (×2): qty 1

## 2021-04-06 MED ORDER — SENNOSIDES-DOCUSATE SODIUM 8.6-50 MG PO TABS
2.0000 | ORAL_TABLET | Freq: Every day | ORAL | Status: DC
  Administered 2021-04-06 – 2021-04-07 (×2): 2 via ORAL
  Filled 2021-04-06 (×2): qty 2

## 2021-04-06 MED ORDER — HYDROMORPHONE HCL 1 MG/ML IJ SOLN: 1.0000 mg | INTRAMUSCULAR | Status: DC | PRN

## 2021-04-06 MED ORDER — KETOROLAC TROMETHAMINE 30 MG/ML IJ SOLN
30.0000 mg | Freq: Four times a day (QID) | INTRAMUSCULAR | Status: AC
Start: 2021-04-06 — End: 2021-04-07
  Administered 2021-04-06 (×2): 30 mg via INTRAVENOUS
  Filled 2021-04-06 (×3): qty 1

## 2021-04-06 MED ORDER — METHYLERGONOVINE MALEATE 0.2 MG/ML IJ SOLN
0.2000 mg | INTRAMUSCULAR | Status: DC | PRN
Start: 2021-04-06 — End: 2021-04-07

## 2021-04-06 MED ORDER — ACETAMINOPHEN 500 MG PO TABS
1000.0000 mg | ORAL_TABLET | Freq: Four times a day (QID) | ORAL | Status: DC
Start: 2021-04-06 — End: 2021-04-07
  Administered 2021-04-06 – 2021-04-07 (×6): 1000 mg via ORAL
  Filled 2021-04-06 (×6): qty 2

## 2021-04-06 MED ORDER — ONDANSETRON HCL 4 MG/2ML IJ SOLN
4.0000 mg | Freq: Once | INTRAMUSCULAR | Status: AC
  Administered 2021-04-06: 4 mg via INTRAVENOUS

## 2021-04-06 MED ORDER — METHYLERGONOVINE MALEATE 0.2 MG/ML IJ SOLN
INTRAMUSCULAR | Status: AC
  Administered 2021-04-06: 0.2 mg via INTRAMUSCULAR
  Filled 2021-04-06: qty 1

## 2021-04-06 MED ORDER — ZOLPIDEM TARTRATE 5 MG PO TABS: 5.0000 mg | ORAL_TABLET | Freq: Every evening | ORAL | Status: DC | PRN

## 2021-04-06 MED ORDER — BUPIVACAINE LIPOSOME 1.3 % IJ SUSP
INTRAMUSCULAR | Status: DC | PRN
  Administered 2021-04-05: 20 mL

## 2021-04-06 MED ORDER — MAGNESIUM HYDROXIDE 400 MG/5ML PO SUSP: 30.0000 mL | ORAL | Status: DC | PRN

## 2021-04-06 MED ORDER — ONDANSETRON HCL 4 MG/2ML IJ SOLN
INTRAMUSCULAR | Status: AC
  Filled 2021-04-06: qty 2

## 2021-04-06 MED ORDER — KETOROLAC TROMETHAMINE 30 MG/ML IJ SOLN: 30.0000 mg | Freq: Once | INTRAMUSCULAR | Status: DC

## 2021-04-06 MED ORDER — PRENATAL MULTIVITAMIN CH
1.0000 | ORAL_TABLET | Freq: Every day | ORAL | Status: DC
  Administered 2021-04-06 – 2021-04-07 (×2): 1 via ORAL
  Filled 2021-04-06 (×2): qty 1

## 2021-04-06 MED ORDER — METHYLERGONOVINE MALEATE 0.2 MG/ML IJ SOLN: 0.2000 mg | Freq: Once | INTRAMUSCULAR | Status: AC

## 2021-04-06 MED ORDER — WITCH HAZEL-GLYCERIN EX PADS: 1.0000 "application " | MEDICATED_PAD | CUTANEOUS | Status: DC | PRN

## 2021-04-06 MED ORDER — MENTHOL 3 MG MT LOZG
1.0000 | LOZENGE | OROMUCOSAL | Status: DC | PRN
  Filled 2021-04-06: qty 9

## 2021-04-06 MED ORDER — COCONUT OIL OIL: 1.0000 "application " | TOPICAL_OIL | Status: DC | PRN

## 2021-04-06 MED ORDER — BUPIVACAINE HCL 0.5 % IJ SOLN
INTRAMUSCULAR | Status: DC | PRN
  Administered 2021-04-05: 30 mL

## 2021-04-06 MED ORDER — SIMETHICONE 80 MG PO CHEW: 80.0000 mg | CHEWABLE_TABLET | ORAL | Status: DC | PRN

## 2021-04-06 MED ORDER — SODIUM CHLORIDE 0.9 % IR SOLN
Status: DC | PRN
  Administered 2021-04-05: 50 mL

## 2021-04-06 MED ORDER — OXYTOCIN-SODIUM CHLORIDE 30-0.9 UT/500ML-% IV SOLN
2.5000 [IU]/h | INTRAVENOUS | Status: AC
  Administered 2021-04-06: 2.5 [IU]/h via INTRAVENOUS
  Filled 2021-04-06: qty 500

## 2021-04-06 MED ORDER — OXYCODONE HCL 5 MG PO TABS: 5.0000 mg | ORAL_TABLET | ORAL | Status: DC | PRN

## 2021-04-06 MED ORDER — TRAMADOL HCL 50 MG PO TABS: 50.0000 mg | ORAL_TABLET | Freq: Four times a day (QID) | ORAL | Status: DC | PRN

## 2021-04-06 MED ORDER — DIPHENHYDRAMINE HCL 25 MG PO CAPS: 25.0000 mg | ORAL_CAPSULE | Freq: Four times a day (QID) | ORAL | Status: DC | PRN

## 2021-04-06 NOTE — Lactation Note (Signed)
This note was copied from a baby's chart. Lactation Consultation Note  Patient Name: Emma Murray [id number] Date: 04/06/2021 Reason for consult: Initial assessment;Primapara;Term Age:27 hours  Initial lactation visit. Mom is P1, 12 hrs post c-section, up and out of the bed, feeling well. Baby is skin to skin with dad, sleeping soundly.  Feedings and output documented since feeding, however dad worried that baby hasn't fed recently.  Reviewed with parents newborn stomach size, feeding patterns and behaviors, 8-12 attempts, hand expression, and changes in feeding behaviors after 24 hours. Encouraged frequent attempts, and offered assistance with attempts. Also discussed the impact that baby being spitty may have on their desire to eat. Encouraged continued skin to skin throughout today with both mom and dad. Parents had no other questions at this time. Whiteboard updated with LC name/number, encouraged to call with next feeding attempt.  Maternal Data Has patient been taught Hand Expression?: Yes Does the patient have breastfeeding experience prior to this delivery?: No  Feeding Mother's Current Feeding Choice: Breast Milk  LATCH Score Latch:  (sleeping)                  Lactation Tools Discussed/Used    Interventions Interventions: Breast feeding basics reviewed;Education;Hand express  Discharge    Consult Status Consult Status: Follow-up Date: 04/06/21 Follow-up type: [place] 04/06/2021, 10:35 AM

## 2021-04-06 NOTE — Anesthesia Postprocedure Evaluation (Signed)
Anesthesia Post Note  Patient: Emma Murray  Procedure(s) Performed: [place]  Patient location during evaluation: Mother Baby Anesthesia Type: Epidural Level of consciousness: oriented and awake and alert Pain management: pain level controlled Vital Signs Assessment: post-procedure vital signs reviewed and stable Respiratory status: spontaneous breathing and respiratory function stable Cardiovascular status: blood pressure returned to baseline and stable Postop Assessment: no headache, no backache, no apparent nausea or vomiting and able to ambulate Anesthetic complications: no   No notable events documented.   Last Vitals:  Vitals:   04/06/21 0235 04/06/21 0410  BP: 125/72 120/75  Pulse: (!) 113 (!) 104  Resp: 18 20  Temp: 36.9 C 37.1 C  SpO2: 96% 94%    Last Pain:  Vitals:   04/06/21 0410  TempSrc: Oral  PainSc:                  Alison Stalling

## 2021-04-06 NOTE — Progress Notes (Signed)
Postop Day  1  Subjective: no complaints, up ad lib, and tolerating PO  Doing well, no concerns. Ambulating without difficulty, pain managed with PO meds, tolerating regular diet, and voiding without difficulty.   No fever/chills, chest pain, shortness of breath, nausea/vomiting, or leg pain. No nipple or breast pain. No headache, visual changes, or RUQ/epigastric pain.  Objective: BP 111/76 (BP Location: Left Arm)   Pulse (!) 107   Temp 98.5 F (36.9 C) (Oral)   Resp 18   Ht 5' 3"  (1.6 m)   Wt 112 kg   LMP 06/19/2020   SpO2 96%   Breastfeeding Unknown   BMI 43.75 kg/m    Physical Exam:  General: alert, cooperative, and no distress Breasts: soft/nontender CV: RRR Pulm: nl effort, CTABL Abdomen: soft, non-tender, active bowel sounds Uterine Fundus: firm Incision: no significant drainage, covered with pressures dressing  Perineum: minimal edema, intact Lochia: appropriate DVT Evaluation: No evidence of DVT seen on physical exam.  Recent Labs    04/04/21 0100 04/06/21 0612  HGB 12.3 10.4*  HCT 35.4* 29.8*  WBC 12.3* 17.0*  PLT 284 243    Assessment/Plan: 25 y.o. G1P1001 postpartum day # 1  -Continue routine postpartum care -Lactation consult PRN for breastfeeding  -Acute blood loss anemia - hemodynamically stable and asymptomatic; start PO ferrous sulfate BID with stool softeners  -Immunization status:   needs MMR and Varicella prior to discharge    Disposition: Continue inpatient postpartum care    LOS: 2 days   Minda Meo, CNM 04/06/2021, 12:15 PM   ----- Drinda Butts  Certified Nurse Midwife [facility] [facility]

## 2021-04-06 NOTE — Progress Notes (Signed)
Postpartum Day # 1: Cesarean Delivery (primary, for arrest of dilation, Category II tracing)  Subjective: Patient reports tolerating PO and mild incisional pain. Reports bleeding was initially heavy but has slowed.  Is working on breastfeeding. Has not yet voided or ambulated as catheter is still in place.     Objective: Vital signs in last 24 hours: Temp:  [98.1 F (36.7 C)-98.7 F (37.1 C)] 98.7 F (37.1 C) (08/30 0410) Pulse Rate:  [77-123] 104 (08/30 0410) Resp:  [15-36] 20 (08/30 0410) BP: (90-131)/(45-86) 120/75 (08/30 0410) SpO2:  [93 %-98 %] 94 % (08/30 0410)  Physical Exam:  General: alert and no distress Lungs: clear to auscultation bilaterally Breasts: normal appearance, no masses or tenderness Heart: regular rate and rhythm, S1, S2 normal, no murmur, click, rub or gallop Abdomen: soft, non-tender; bowel sounds normal; no masses,  no organomegaly Pelvis: Lochia appropriate, Uterine Fundus firm, Incision: healing well Extremities: DVT Evaluation: No evidence of DVT seen on physical exam. SCDs in place. Negative Homan's sign. No cords or calf tenderness. No significant calf/ankle edema.  Recent Labs    04/04/21 0100 04/06/21 0612  HGB 12.3 10.4*  HCT 35.4* 29.8*    Assessment/Plan: Status post Cesarean section. Doing well postoperatively.  Breastfeeding and Lactation consult Regular diet as tolerated. Continue pain management Remove foley catheter Discontinue IVF when tolerating regular diet and voiding without catheter. Continue current care.  Rubie Maid, MD [facility]

## 2021-04-06 NOTE — Progress Notes (Signed)
Notified R. Mcvey, CNM by phone of patient's continued bleeding and bogginess as she has has some moderate to heavy bleeding the last few checks. New orders given see MAR.

## 2021-04-07 MED ORDER — COVID-19 MRNA VACC (MODERNA) 100 MCG/0.5ML IM SUSP
0.5000 mL | Freq: Once | INTRAMUSCULAR | Status: AC
  Administered 2021-04-07: 0.5 mL via INTRAMUSCULAR
  Filled 2021-04-07: qty 0.5

## 2021-04-07 MED ORDER — MEDROXYPROGESTERONE ACETATE 150 MG/ML IM SUSP
150.0000 mg | Freq: Once | INTRAMUSCULAR | Status: AC
  Administered 2021-04-07: 150 mg via INTRAMUSCULAR
  Filled 2021-04-07: qty 1

## 2021-04-07 MED ORDER — SENNOSIDES-DOCUSATE SODIUM 8.6-50 MG PO TABS: 2.0000 | ORAL_TABLET | Freq: Every day | ORAL | Status: AC

## 2021-04-07 MED ORDER — WITCH HAZEL-GLYCERIN EX PADS: 1.0000 "application " | MEDICATED_PAD | CUTANEOUS | 12 refills | Status: AC | PRN

## 2021-04-07 MED ORDER — ACETAMINOPHEN 500 MG PO TABS: 1000.0000 mg | ORAL_TABLET | Freq: Four times a day (QID) | ORAL | 0 refills | Status: AC

## 2021-04-07 MED ORDER — COCONUT OIL OIL: 1.0000 "application " | TOPICAL_OIL | 0 refills | Status: AC | PRN

## 2021-04-07 MED ORDER — COVID-19 MRNA VACC (MODERNA) 100 MCG/0.5ML IM SUSP
0.5000 mL | Freq: Once | INTRAMUSCULAR | Status: DC
  Filled 2021-04-07: qty 0.5

## 2021-04-07 MED ORDER — IBUPROFEN 600 MG PO TABS: 600.0000 mg | ORAL_TABLET | Freq: Four times a day (QID) | ORAL | 0 refills | Status: AC

## 2021-04-07 MED ORDER — FERROUS SULFATE 325 (65 FE) MG PO TABS: 325.0000 mg | ORAL_TABLET | ORAL | 3 refills | Status: AC

## 2021-04-07 MED ORDER — DIBUCAINE (PERIANAL) 1 % EX OINT: 1.0000 "application " | TOPICAL_OINTMENT | CUTANEOUS | Status: AC | PRN

## 2021-04-07 MED ORDER — OXYCODONE HCL 5 MG PO TABS: 5.0000 mg | ORAL_TABLET | Freq: Four times a day (QID) | ORAL | 0 refills | Status: AC | PRN

## 2021-04-07 NOTE — TOC Initial Note (Signed)
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 Confirmed with nurse consult placed in error. Patient with no positive drug screens during pregnancy. No other needs or concerns according to nurse.   LVMM with MOB for call back if any needs or concerns TOC could assist with.   Patient Goals and CMS Choice        Expected Discharge Plan and Services           Expected Discharge Date: 04/07/21                                    Prior Living Arrangements/Services                       Activities of Daily Living Home Assistive Devices/Equipment: None ADL Screening (condition at time of admission) Patient's cognitive ability adequate to safely complete daily activities?: Yes Is the patient deaf or have difficulty hearing?: No Does the patient have difficulty seeing, even when wearing glasses/contacts?: No Does the patient have difficulty concentrating, remembering, or making decisions?: No Patient able to express need for assistance with ADLs?: Yes Does the patient have difficulty dressing or bathing?: No Independently performs ADLs?: Yes (appropriate for developmental age) Does the patient have difficulty walking or climbing stairs?: No Weakness of Legs: None Weakness of Arms/Hands: None  Permission Sought/Granted                  Emotional Assessment              Admission diagnosis:  Post-dates pregnancy [O48.0] Patient Active Problem List   Diagnosis Date Noted   Post-dates pregnancy 04/04/2021   MDD (major depressive disorder) 11/26/2019   PCP:  System, Provider Not In Pharmacy:   [id number] [place] [address]Phone: [phone]: [phone]    Social Determinants of Health (SDOH) Interventions    Readmission Risk Interventions No flowsheet data found.

## 2021-04-07 NOTE — Discharge Summary (Addendum)
Obstetrical Discharge Summary  Patient Name: Emma Murray DOB: July 03, 1996 MRN: [id number]  Date of Admission: 04/04/2021 Date of Delivery: 04/05/21 Delivered by: Dr Marcelline Mates Date of Discharge: 04/07/2021  Primary OB: [facility]  [id number] last menstrual period was 06/19/2020. EDC Estimated Date of Delivery: 03/28/21 Gestational Age at Delivery: [redacted]w[redacted]d  Antepartum complications:  1. Obesity in pregnancy  2. Rubella and Varicella non-immune  Admitting Diagnosis: late term at 447wksSecondary Diagnosis: active phase arrest, nonreassuring fetal heart rate, primary LTCS  Patient Active Problem List   Diagnosis Date Noted   Post-dates pregnancy 04/04/2021   MDD (major depressive disorder) 11/26/2019    Augmentation: AROM, Pitocin, and Cytotec Complications: None Intrapartum complications/course: IOL for late term with cytotec then Pitocin on day 1 induction, on day 2 induction, AROM performed and restarted Pitocin. Recurrent late decels and active phase arrest --> primary LTCS performed by Dr CMarcelline Mates infant asynclitic and [place].  Date of Delivery: 04/05/21 Delivered By: ARubie MaidMD Delivery Type: primary cesarean section, low transverse incision Anesthesia: epidural Placenta: manual Laceration: none Episiotomy: none Newborn Data: Live born female "Kiyori" Birth Weight: 7 lb 11.8 oz (3510 g) APGAR: 8, 9  Newborn Delivery   Birth date/time: 04/05/2021 21:59:00 Delivery type: C-Section, Low Transverse Trial of labor: Yes C-section categorization: Primary        Postpartum Procedures: none Edinburgh:  Edinburgh Postnatal Depression Scale Screening Tool 04/07/2021  I have been able to laugh and see the funny side of things. 0  I have looked forward with enjoyment to things. 0  I have blamed myself unnecessarily when things went wrong. 0  I have been anxious or worried for no good reason. 0  I have felt scared or panicky for no good  reason. 0  Things have been getting on top of me. 0  I have been so unhappy that I have had difficulty sleeping. 0  I have felt sad or miserable. 0  I have been so unhappy that I have been crying. 0  The thought of harming myself has occurred to me. 0  Edinburgh Postnatal Depression Scale Total 0      Post partum course: Cesarean Section:  Patient had an uncomplicated postpartum course.  By time of discharge on POD#2, her pain was controlled on oral pain medications; she had appropriate lochia and was ambulating, voiding without difficulty, tolerating regular diet and passing flatus.   She was deemed stable for discharge to home.    Discharge Physical Exam:  BP 112/87 (BP Location: Right Arm)   Pulse 90   Temp 98.3 F (36.8 C) (Oral)   Resp 20   Ht '5\' 3"'$  (1.6 m)   Wt 112 kg   LMP 06/19/2020   SpO2 98%   Breastfeeding Unknown   BMI 43.75 kg/m   General: NAD CV: RRR Pulm: CTABL, nl effort ABD: s/nd/nt, fundus firm and below the umbilicus Lochia: moderate Incision: c/d/i, healing well, no significant drainage, no dehiscence, no significant erythema DVT Evaluation: LE non-ttp, no evidence of DVT on exam.  Hemoglobin  Date Value Ref Range Status  04/06/2021 10.4 (L) 12.0 - 15.0 g/dL Final   HCT  Date Value Ref Range Status  04/06/2021 29.8 (L) 36.0 - 46.0 % Final     Disposition: stable, discharge to home. Baby Feeding: breastmilk Baby Disposition: home with mom  Rh Immune globulin given: n/a Rubella vaccine given: NON-immune, offer prior to DC Varicella vaccine given: NON-immune, offer prior to DC Tdap  vaccine given in AP or PP setting: declined Flu vaccine given in AP or PP setting: declined  Contraception: Depo  Prenatal Labs:     Plan:  Emma Murray was discharged to home in good condition. Follow-up appointment with delivering provider in 6 weeks.  Discharge Medications: Allergies as of 04/07/2021   No Known Allergies      Medication List      STOP taking these medications    hydrOXYzine 25 MG tablet Commonly known as: ATARAX/VISTARIL   sertraline 25 MG tablet Commonly known as: ZOLOFT   traZODone 50 MG tablet Commonly known as: DESYREL       TAKE these medications    acetaminophen 500 MG tablet Commonly known as: TYLENOL Take 2 tablets (1,000 mg total) by mouth every 6 (six) hours.   coconut oil Oil Apply 1 application topically as needed.   dibucaine 1 % Oint Commonly known as: NUPERCAINAL Place 1 application rectally as needed for hemorrhoids.   ferrous sulfate 325 (65 FE) MG tablet Take 1 tablet (325 mg total) by mouth every other day. Start taking on: April 08, 2021   ibuprofen 600 MG tablet Commonly known as: ADVIL Take 1 tablet (600 mg total) by mouth every 6 (six) hours.   oxyCODONE 5 MG immediate release tablet Commonly known as: Oxy IR/ROXICODONE Take 1 tablet (5 mg total) by mouth every 6 (six) hours as needed for up to 7 days for moderate pain.   prenatal multivitamin Tabs tablet Take 1 tablet by mouth daily at 12 noon.   senna-docusate 8.6-50 MG tablet Commonly known as: Senokot-S Take 2 tablets by mouth daily. Start taking on: April 08, 2021   witch hazel-glycerin pad Commonly known as: TUCKS Apply 1 application topically as needed for hemorrhoids.          Follow-up Information     McVey, Murray Hodgkins, CNM Follow up in 6 week(s).   Specialty: Obstetrics and Gynecology Contact information: [place] [address] [phone]         Schermerhorn, Gwen Her, MD Follow up in 2 week(s).   Specialty: Obstetrics and Gynecology Why: post op incision check Contact information: [address] [place] [address] [phone]                 Signed:  Ed Blalock, [place] 04/07/2021  2:55 PM

## 2021-04-07 NOTE — Progress Notes (Signed)
Pt d/c in the company of baby and SO. D/c instructions reviewed with pt. Pt verbalized understanding.

## 2021-04-07 NOTE — Lactation Note (Signed)
This note was copied from a baby's chart. Lactation Consultation Note  Patient Name: Girl Montaya Paulino Vallo [id number] Date: 04/07/2021 Reason for consult: Follow-up assessment;Primapara;Term Age:27 hours  Lactation follow-up before anticipated discharge. Mom and baby skin to skin in blue chair.  Mom reports baby continuing to feed well, no discomfort or concerns. Baby has had several wet and poop diapers and passed 24hr screens.   Encouraged continued feeding on demand with early cues, ongoing potential for cluster feeding, and output expectations. Reviewed with mom anticipated breast changes, breast fullness and engorgement, and management of both, and nipple care.  Information for outpatient lactation support and services provided, encouraged mom to call with any questions/concerns.  Maternal Data Has patient been taught Hand Expression?: Yes Does the patient have breastfeeding experience prior to this delivery?: No  Feeding Mother's Current Feeding Choice: Breast Milk  LATCH Score                    Lactation Tools Discussed/Used    Interventions Interventions: Breast feeding basics reviewed;Hand express;Education  Discharge Discharge Education: Warning signs for feeding baby;Engorgement and breast care;Outpatient recommendation  Consult Status Consult Status: Complete Date: 04/07/21 Follow-up type: Call as needed    Lavonia Drafts 04/07/2021, 10:25 AM

## 2022-01-22 IMAGING — DX DG WRIST COMPLETE 3+V*L*
3 series · 3 of 3 positions shown · non-contrast
Comparison: No prior.

CLINICAL DATA: Post reduction left wrist.

EXAM:
LEFT WRIST - COMPLETE 3+ VIEW

[wrist ap]
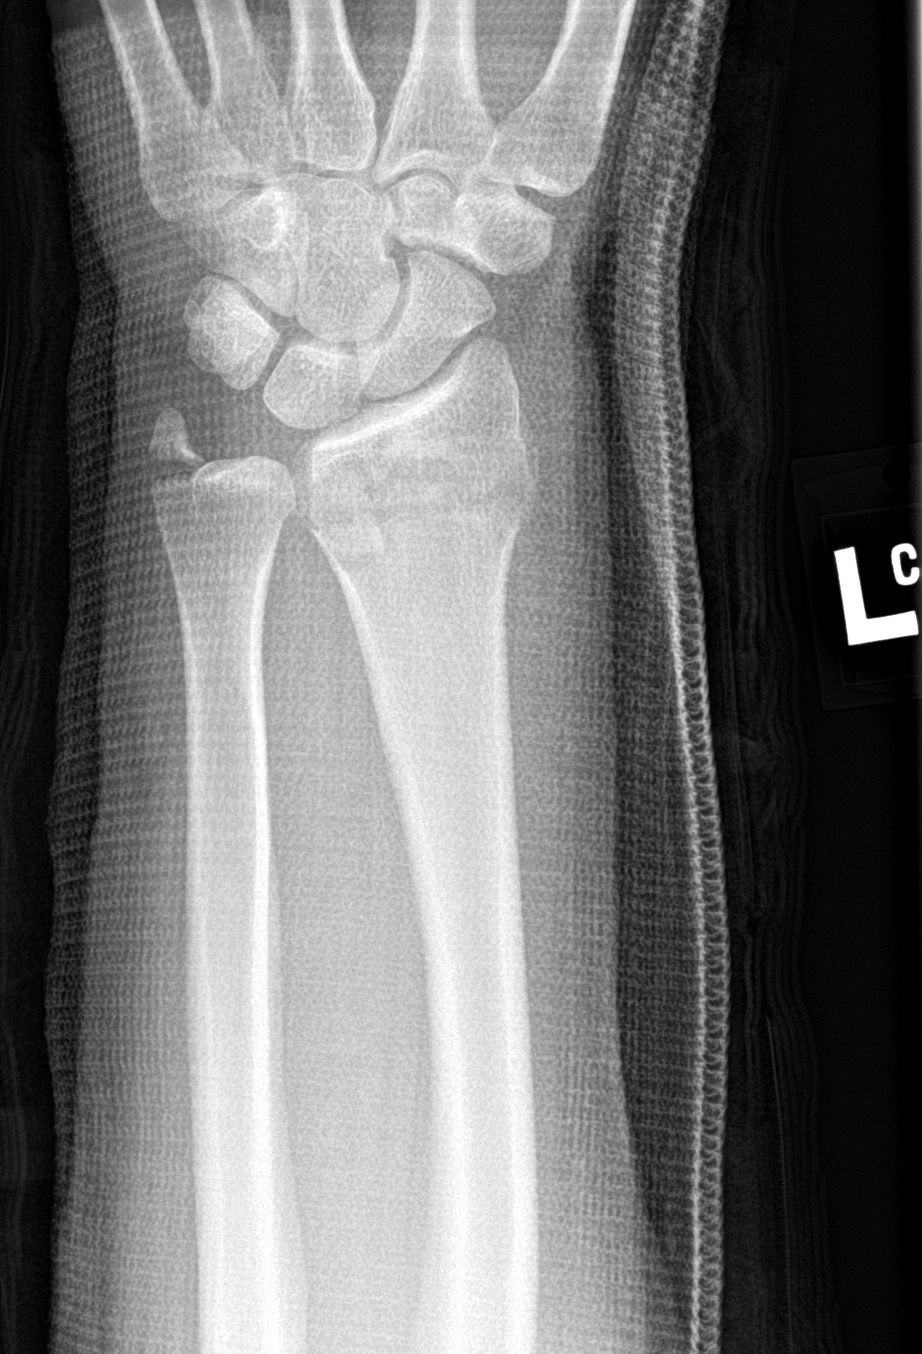

[wrist obl]
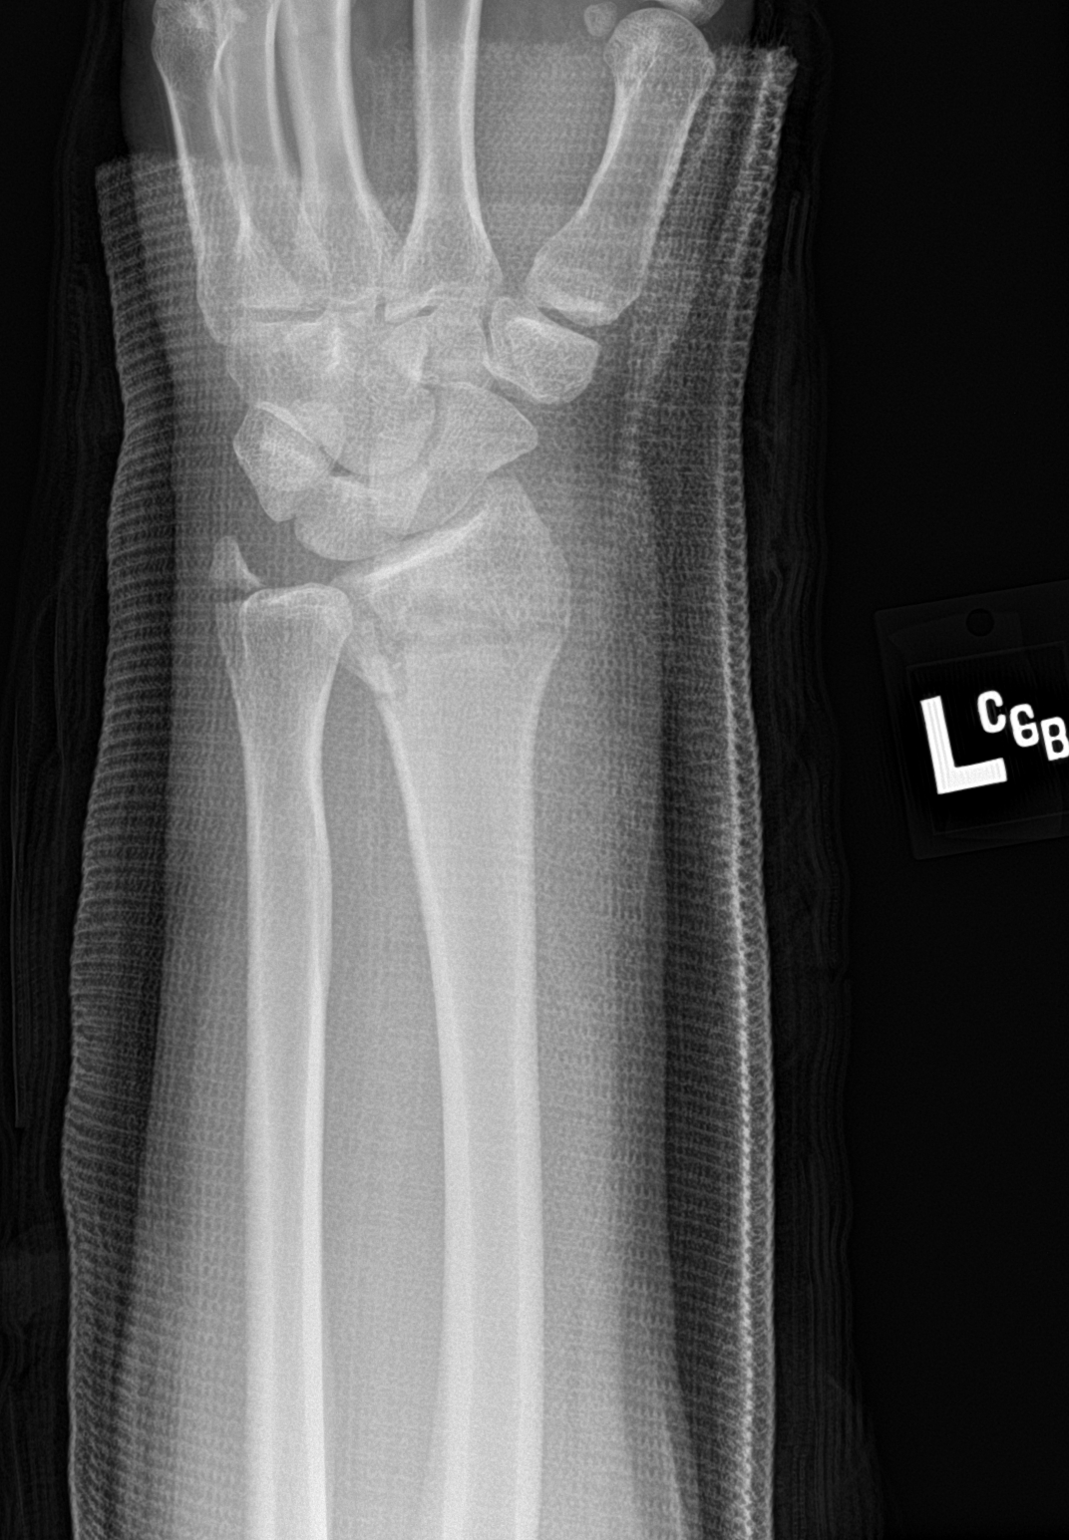

[wrist lat]
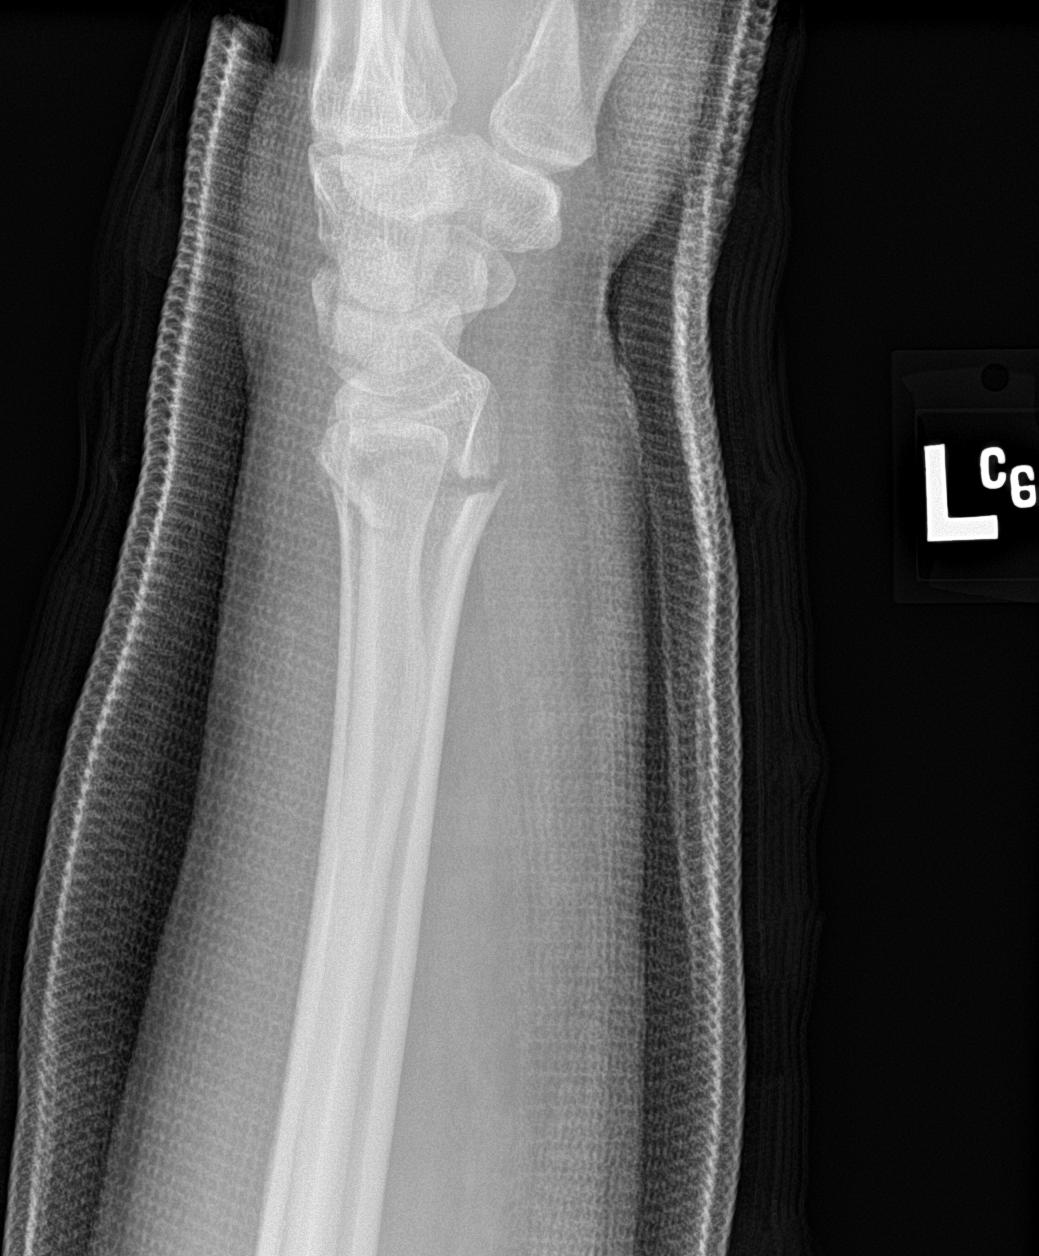

[3 of 3 positions shown; findings below may reference images not displayed]

FINDINGS: Casting of distal left radial and ulnar fractures are present.
Improved alignment from prior images.
IMPRESSION: Patient post casting of distal left radial and ulnar fractures.

## 2022-01-22 IMAGING — DX DG WRIST COMPLETE 3+V*L*
4 series · 4 of 4 positions shown · non-contrast
Comparison: None.

CLINICAL DATA: Fall, deformity

EXAM:
LEFT WRIST - COMPLETE 3+ VIEW

[wrist ap (1 of 2)]
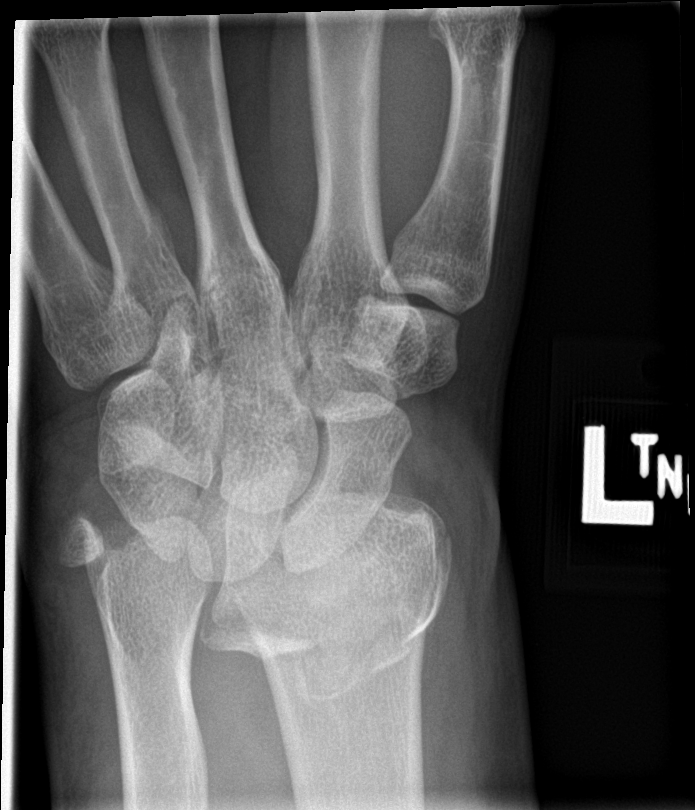

[wrist obl]
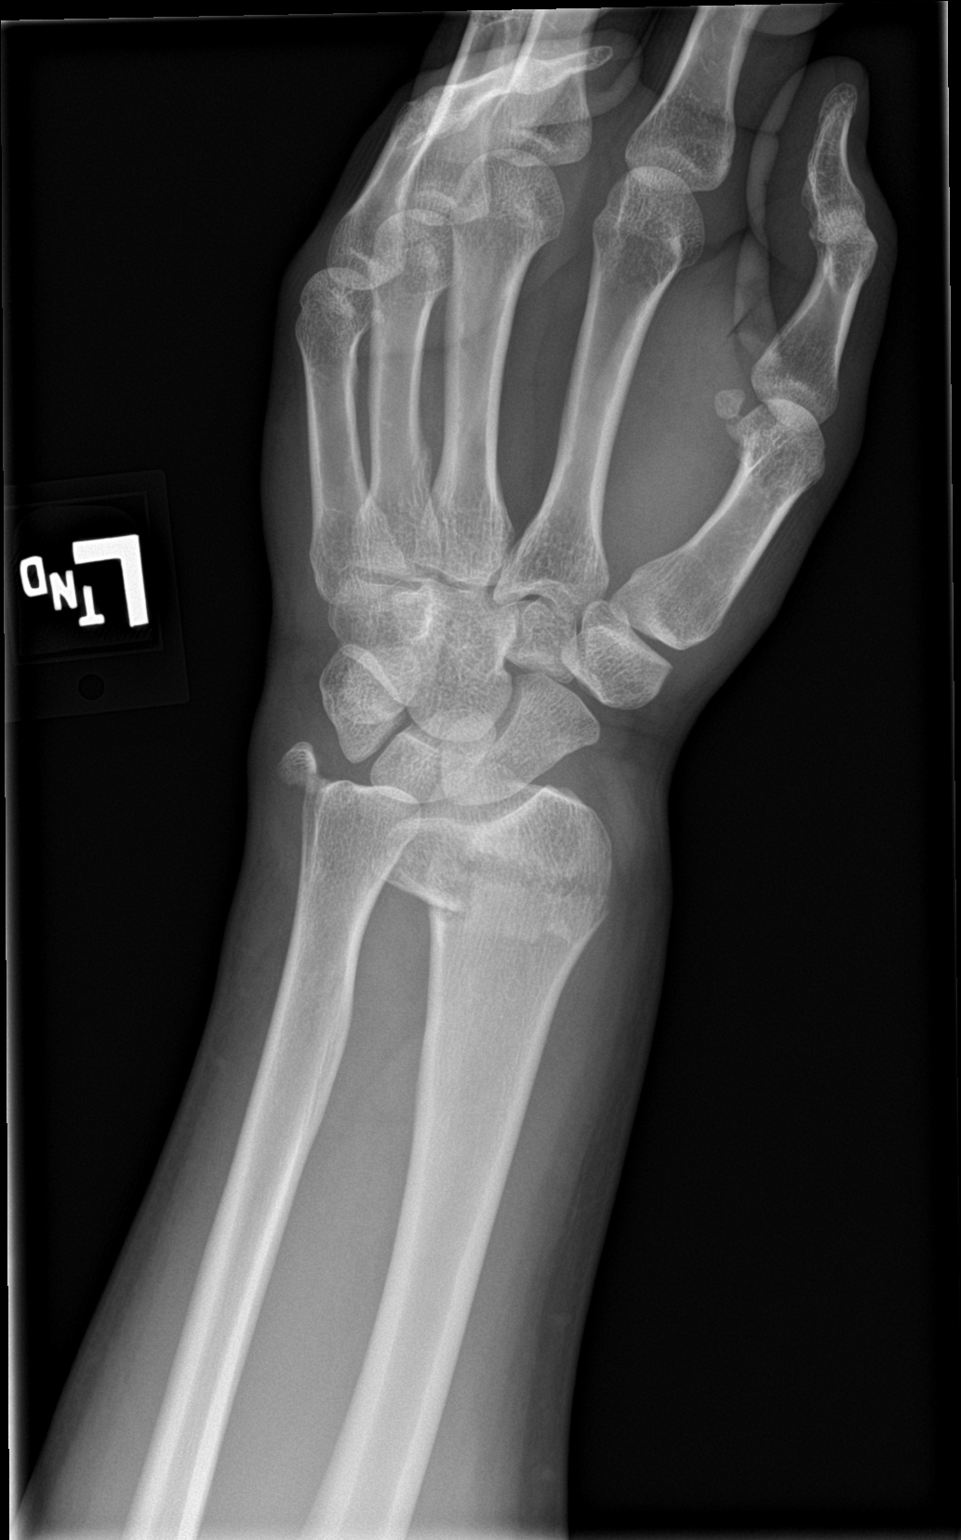

[wrist lat]
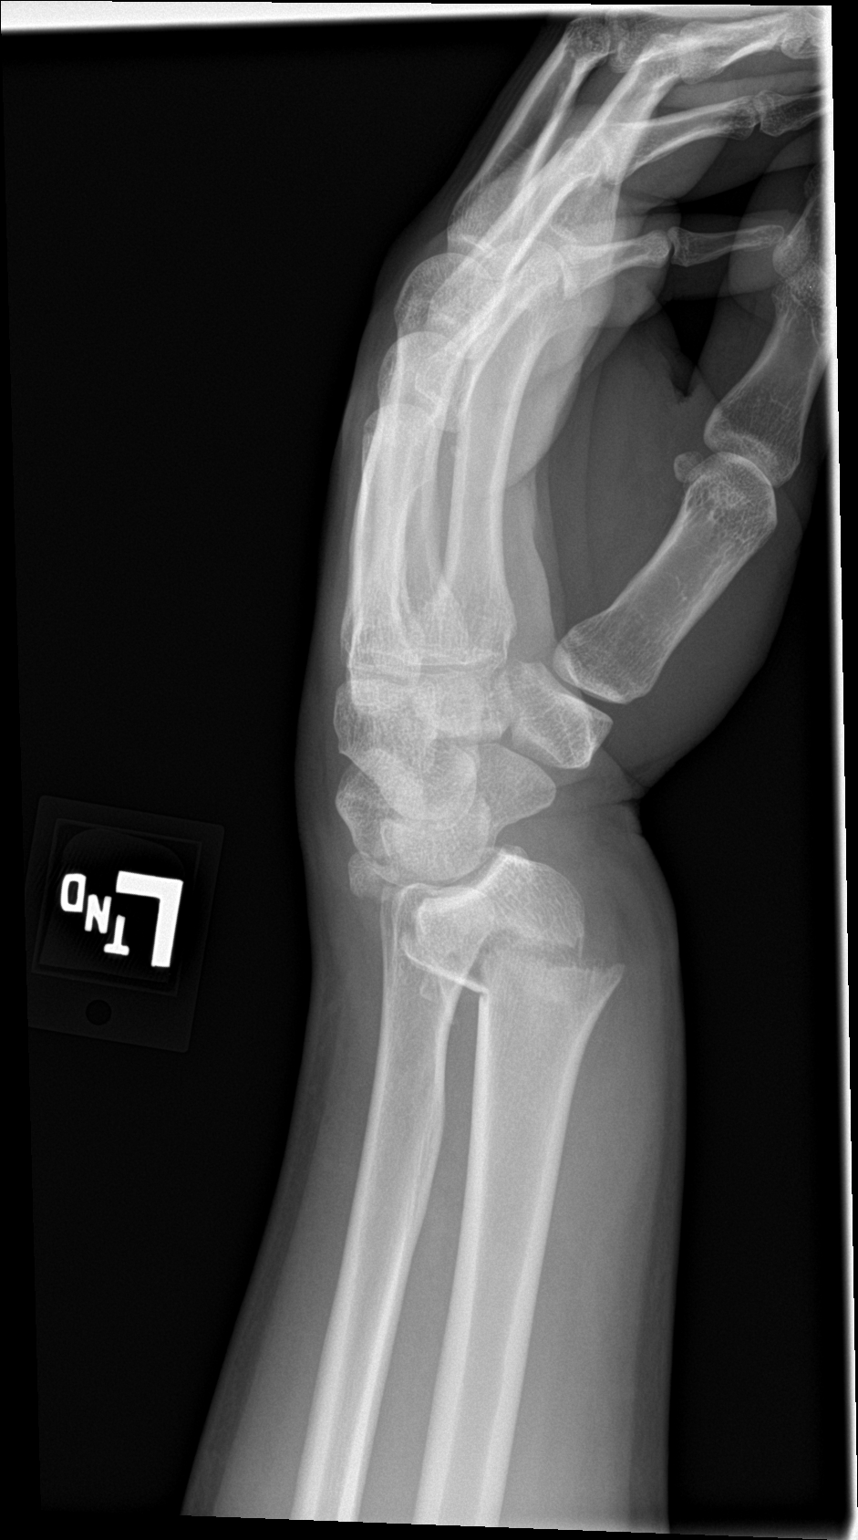

[wrist ap (2 of 2)]
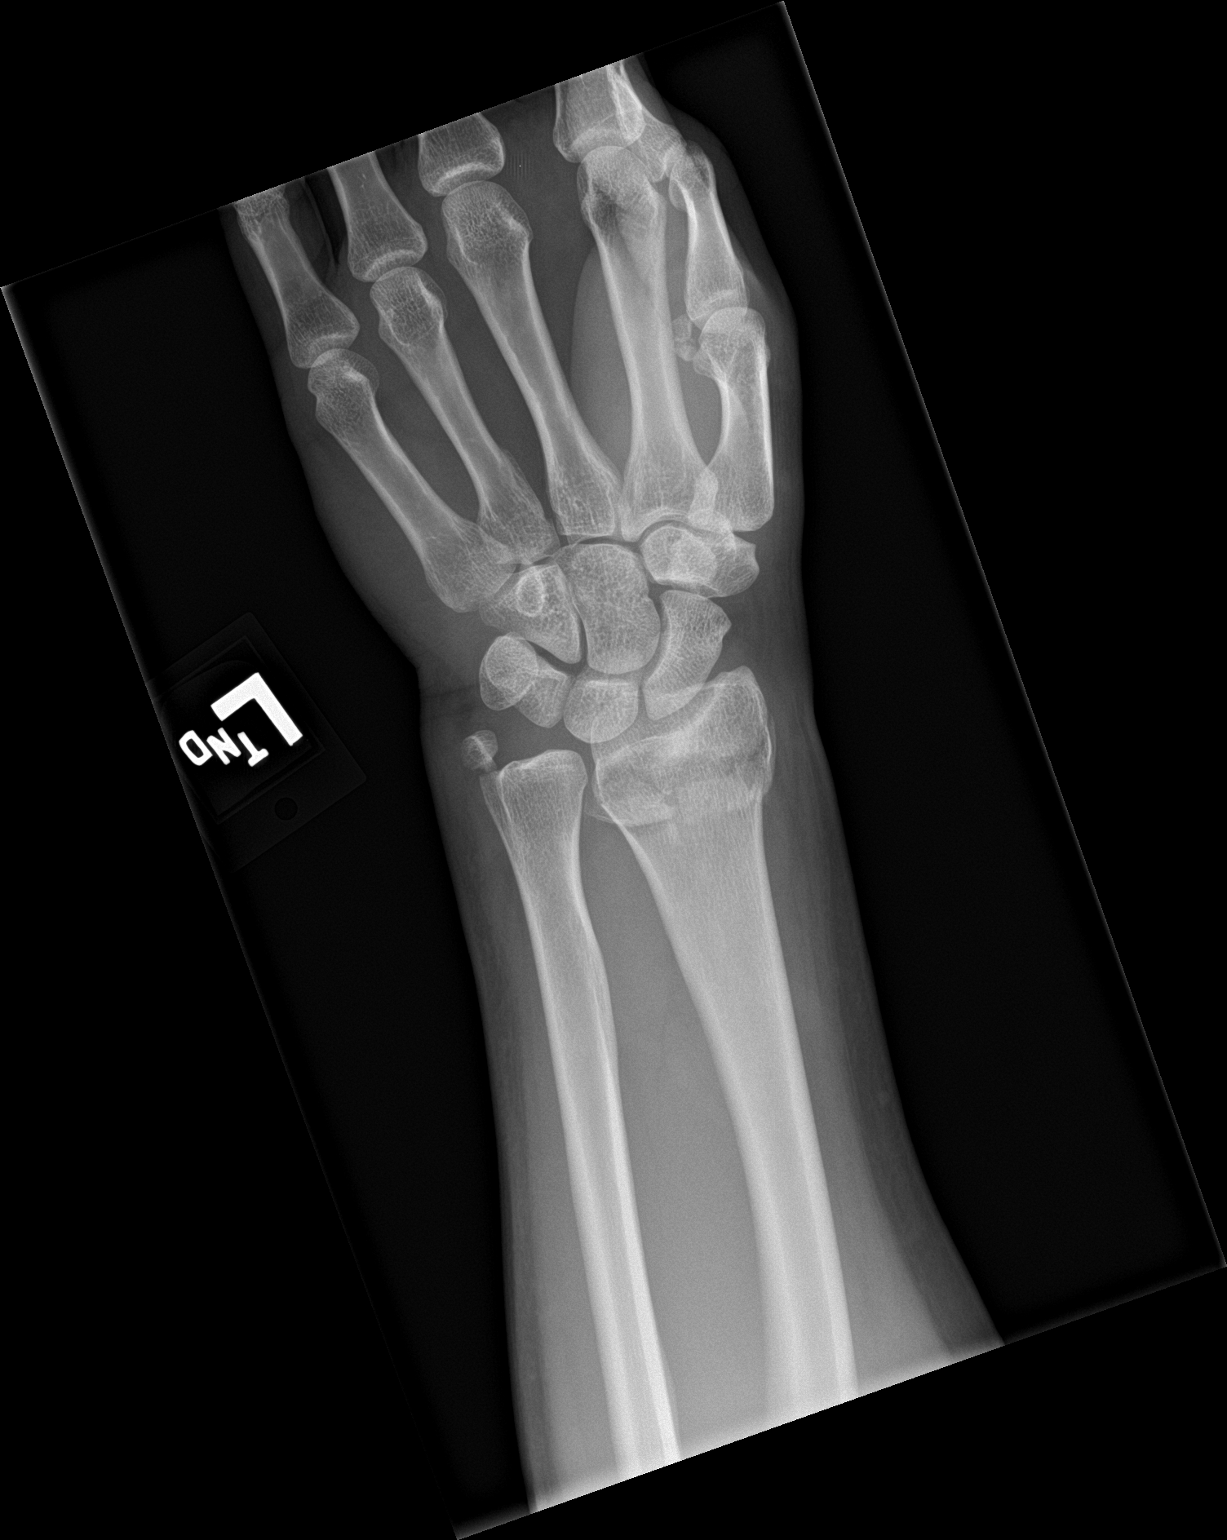

[4 of 4 positions shown; findings below may reference images not displayed]

FINDINGS: Fractures through the distal radius with dorsal displacement and
angulation. No definite intra-articular extension. Mildly displaced
fracture of the ulnar styloid. Soft tissue swelling at the wrist.
IMPRESSION: Acute distal radius fracture with dorsal displacement and
angulation. Acute mildly displaced ulnar styloid fracture.
